# Patient Record
Sex: Male | Born: 1955 | Hispanic: Yes | Marital: Married | Smoking: Former smoker
Health system: Southern US, Community
[De-identification: ages and names within clinical notes are randomized; demographics above are authoritative.]

## PROBLEM LIST (undated history)

## (undated) DIAGNOSIS — M1990 Unspecified osteoarthritis, unspecified site: Secondary | ICD-10-CM

## (undated) DIAGNOSIS — E119 Type 2 diabetes mellitus without complications: Secondary | ICD-10-CM

## (undated) DIAGNOSIS — K219 Gastro-esophageal reflux disease without esophagitis: Secondary | ICD-10-CM

## (undated) DIAGNOSIS — I639 Cerebral infarction, unspecified: Secondary | ICD-10-CM

## (undated) DIAGNOSIS — I10 Essential (primary) hypertension: Secondary | ICD-10-CM

---

## 2013-02-19 ENCOUNTER — Encounter (HOSPITAL_COMMUNITY): Payer: Self-pay

## 2013-02-19 ENCOUNTER — Emergency Department (INDEPENDENT_AMBULATORY_CARE_PROVIDER_SITE_OTHER)
Admission: EM | Admit: 2013-02-19 | Discharge: 2013-02-19 | Disposition: A | Discharge status: Home / Self Care | Payer: Worker's Compensation | Source: Home / Self Care | Attending: Family Medicine | Admitting: Family Medicine

## 2013-02-19 DIAGNOSIS — M5126 Other intervertebral disc displacement, lumbar region: Secondary | ICD-10-CM

## 2013-02-19 HISTORY — DX: Type 2 diabetes mellitus without complications: E11.9

## 2013-02-19 HISTORY — DX: Essential (primary) hypertension: I10

## 2013-02-19 MED ORDER — METHYLPREDNISOLONE 4 MG PO KIT: PACK | ORAL | Status: DC

## 2013-02-19 MED ORDER — CYCLOBENZAPRINE HCL 5 MG PO TABS: 5.0000 mg | ORAL_TABLET | Freq: Three times a day (TID) | ORAL | Status: DC | PRN

## 2013-02-19 MED ORDER — KETOROLAC TROMETHAMINE 30 MG/ML IJ SOLN
30.0000 mg | Freq: Once | INTRAMUSCULAR | Status: AC
  Administered 2013-02-19: 30 mg via INTRAMUSCULAR

## 2013-02-19 MED ORDER — METHYLPREDNISOLONE ACETATE 40 MG/ML IJ SUSP
80.0000 mg | Freq: Once | INTRAMUSCULAR | Status: AC
  Administered 2013-02-19: 80 mg via INTRAMUSCULAR

## 2013-02-19 MED ORDER — KETOROLAC TROMETHAMINE 30 MG/ML IJ SOLN
INTRAMUSCULAR | Status: AC
  Filled 2013-02-19: qty 1

## 2013-02-19 MED ORDER — METHYLPREDNISOLONE ACETATE 80 MG/ML IJ SUSP
INTRAMUSCULAR | Status: AC
  Filled 2013-02-19: qty 1

## 2013-02-19 NOTE — ED Provider Notes (Signed)
History     CSN: [id number]  Arrival date & time 02/19/13  1702   First MD Initiated Contact with Patient 02/19/13 1713      Chief Complaint  Patient presents with  . Back Pain    (Consider location/radiation/quality/duration/timing/severity/associated sxs/prior treatment) Patient is a 57 y.o. male presenting with back pain. The history is provided by the patient.  Back Pain Location:  Lumbar spine Quality:  Shooting Radiates to:  L thigh and L foot Pain severity:  Moderate Onset quality:  Sudden Progression:  Unchanged Chronicity:  New Context: lifting heavy objects and occupational injury   Relieved by:  None tried Associated symptoms: leg pain and numbness   Associated symptoms comment:  Unable to raise left toes when standing.   Past Medical History  Diagnosis Date  . Diabetes mellitus without complication   . Hypertension     History reviewed. No pertinent past surgical history.  History reviewed. No pertinent family history.  History  Substance Use Topics  . Smoking status: Not on file  . Smokeless tobacco: Not on file  . Alcohol Use: Not on file      Review of Systems  Constitutional: Negative.   Gastrointestinal: Negative.   Genitourinary: Negative.   Musculoskeletal: Positive for back pain and gait problem. Negative for joint swelling.  Skin: Negative.   Neurological: Positive for numbness.    Allergies  Review of patient's allergies indicates no known allergies.  Home Medications   Current Outpatient Rx  Name  Route  Sig  Dispense  Refill  . cyclobenzaprine (FLEXERIL) 5 MG tablet   Oral   Take 1 tablet (5 mg total) by mouth 3 (three) times daily as needed for muscle spasms.   30 tablet   0   . methylPREDNISolone (MEDROL DOSEPAK) 4 MG tablet      follow package directions, start on sat, take until finished   21 tablet   0     BP 161/81  Pulse 101  Temp(Src) 98 F (36.7 C) (Oral)  Resp 16  SpO2 97%  Physical Exam  Nursing  note and vitals reviewed. Constitutional: He is oriented to person, place, and time. He appears well-developed and well-nourished. He appears distressed.  Musculoskeletal: He exhibits tenderness.       Lumbar back: He exhibits decreased range of motion, tenderness, pain and spasm. He exhibits no edema and normal pulse.  Neurological: He is alert and oriented to person, place, and time. A sensory deficit is present.  ehl weakness on left, pos slr left, pulse 2+.    ED Course  Procedures (including critical care time)  Labs Reviewed - No data to display No results found.   1. Herniated lumbar intervertebral disc       MDM          Linna Hoff, MD 02/19/13 [phone]

## 2013-02-19 NOTE — ED Notes (Signed)
C/o pain in left hip and back after carrying heavy supplies the day prior (2-21) NAD at present, minimal relief w OTC medications

## 2013-03-30 ENCOUNTER — Other Ambulatory Visit (HOSPITAL_COMMUNITY): Payer: Self-pay | Admitting: Orthopaedic Surgery

## 2013-03-31 NOTE — H&P (Signed)
[facility]   A Division of [employer] [facility]   [address] [place], [place] [address] Telephone: [phone]  Fax: [phone]     PATIENT: Walter, Holland   MR#: [id number]  DOB: 1956-12-03       A 57 year old male returns for followup of his on-the-job injuries continuing to have back pain, leg pain and left foot drop.  His foot drop has not improved.  I can easily overcome anterior tib and EHL with a single finger.  We had ordered and epidural to see if it would help him.  He does have spinal stenosis at 2-3 from a central disk protrusion at this level and then has the large extruded disk fragment at left 4-5 with bunching of the nerve roots above both the 2-3 level and also above the 4-5 level where he has a cephalad migrated fragment that extends up the level of the L4 pedicle.  There is severe compression noted.  Patient is normally followed for his medical problems by Dr. Donette Larry.     PAST MEDICAL HISTORY:  He is a diabetic.     PAST SURGICAL HISTORY:  Denies.   FAMILY HISTORY:  Positive for hypertension, diabetes.   SOCIAL HISTORY:  Patient is divorced.  He is an Oceanographer.  He does not currently smoke, but was a past smoker.   CURRENT MEDICATIONS:  He is on insulin, also metformin.    ALLERGIES:  None.   REVIEW OF SYSTEMS:  A 14-point review of systems positive for acid reflux, diabetes and arthritis.  Patient denies any back problems prior to his on-the-job injury on 02/19/2013 when he was carrying a heavy metal shower up the stairs at work.  He did admit that on occasion he had a little bit of mild discomfort in his back, but never had any weakness.  At the time of the carrying, he had sharp onset of pain.  Someone had to help him and the pain radiated down his back into his left leg and his left calf and into the dorsum of his foot with persistent foot-drop since that time.     PHYSICAL EXAMINATION:  Patient is alert and  oriented, WD, WN, NAD.  Extraocular movements are intact.  No accessory muscle inspiratory effort.  Lungs:  Clear.  Heart:  Regular rate and rhythm.  Abdomen:  Soft, nontender.  No pain with hip range of motion.  Positive straight-leg raising 45 degrees on the left.  Left foot drop.  Anterior tib slight atrophy.  EHL, anterior tib will fire, but are easily overcome with 1-finger pressure.  FHL, posterior tib is strong.  Peroneal is strong.  Gastrocsoleus is normal.  He cannot walk on his heals.  Has foot-drop gait.  Opposite leg shows negative straight-leg raising to 110 degrees.  Knee exam shows good stability.  No effusion.  No pain with hip range of motion.  Negative FABER test.     RADIOGRAPHS:  Previous lumbar x-rays were normal.  MRI scan is as listed above with large disk protrusion causing spinal stenosis and redundant nerve roots above this level due to the severe stenosis with facet and ligamentum hypertrophy and also diffuse bulging of the disk.  Minimal bulge at 3-4 and large extruded fragment at 4-5, cephalad migration with severe compression on the lateral recess.    IMPRESSION: Spinal stenosis L2-3 secondary to central HNP and HNP left L4-5 with free fragment.   PLAN:  At this point, patient would like  to proceed with surgery and wants to skip the epidural injection.  Plan would be left L4 hemilaminectomy and microdiskectomy with removal of the free fragment.  He will also need surgery at the 2-3 level due to the severe spinal stenosis and if only the 4-5, which is the acute level, is operatively treated; he may get some swelling and develop acute cauda equina at the 2-3 level, so both levels would need to be surgically addressed.  He has some foraminal narrowing at 5-1, but no definite nerve root compression and 2-level procedure with diskectomy at these levels.  Removal of some of the ligaments and overhanging spur should take care of his problem.  Patient is rather stoic concerning his pain  and is not taking narcotics, but has got progressive weakness with progression of tension signs and microdiskectomy is indicated.  Usual length of time out of work would be 6 weeks and then he would be back at work with restriction on heavy lifting for approximately 3 weeks with no lifting more than 30 pounds.  Procedure discussed.  Use of the operative microscope.  Overnight stay in the hospital.  Some postoperative pain medication is needed, was discussed.  Risks of surgery including risk of dural tear, recurrent disk herniation, progression of degenerative problems.  He does not have any instability.  I discussed fusion is not indicated.  Questions were elicited and answered.       For additional information please see handwritten notes, reports, orders and prescriptions in this chart.      Lean Fayson C. Ophelia Charter, M.D.

## 2013-04-01 NOTE — Pre-Procedure Instructions (Signed)
Ranson Belluomini  04/01/2013   Your procedure is scheduled on:  Monday, April 14th.  Report to [facility] at 10:30 AM.  Call this number if you have problems the morning of surgery: [phone]   Remember:   Do not eat food or drink liquids after midnight.   Take these medicines the morning of surgery with A SIP OF WATER: Amlodipine (Norvac), Gabapentin (Neurontin) Do not take any diabetic medications the morning of your surgery   Do not wear jewelry  Do not wear lotions or colognes.  Men may shave face and neck.  Do not bring valuables to the hospital.  Contacts, dentures or bridgework may not be worn into surgery.  Leave suitcase in the car. After surgery it may be brought to your room.  For patients admitted to the hospital, checkout time is 11:00 AM the day of discharge.   Patients discharged the day of surgery will not be allowed to drive home.  Name and phone number of your driver: -  Special Instructions: Shower using CHG 2 nights before surgery and the night before surgery.  If you shower the day of surgery use CHG.  Use special wash - you have one bottle of CHG for all showers.  You should use approximately 1/3 of the bottle for each shower.   Please read over the following fact sheets that you were given: Pain Booklet, Coughing and Deep Breathing and Surgical Site Infection Prevention

## 2013-04-02 ENCOUNTER — Encounter (HOSPITAL_COMMUNITY): Payer: Self-pay

## 2013-04-02 ENCOUNTER — Encounter (HOSPITAL_COMMUNITY)
Admission: RE | Admit: 2013-04-02 | Discharge: 2013-04-02 | Disposition: A | Discharge status: Home / Self Care | Payer: Worker's Compensation | Source: Ambulatory Visit | Attending: Orthopaedic Surgery | Admitting: Orthopaedic Surgery

## 2013-04-02 HISTORY — DX: Gastro-esophageal reflux disease without esophagitis: K21.9

## 2013-04-02 HISTORY — DX: Unspecified osteoarthritis, unspecified site: M19.90

## 2013-04-02 LAB — SURGICAL PCR SCREEN: MRSA, PCR: NEGATIVE

## 2013-04-02 LAB — CBC
MCH: 30.5 pg (ref 26.0–34.0)
MCHC: 35.6 g/dL (ref 30.0–36.0)
MCV: 85.8 fL (ref 78.0–100.0)
Platelets: 249 10*3/uL (ref 150–400)
RBC: 4.72 MIL/uL (ref 4.22–5.81)

## 2013-04-02 LAB — COMPREHENSIVE METABOLIC PANEL
ALT: 30 U/L (ref 0–53)
AST: 29 U/L (ref 0–37)
CO2: 29 mEq/L (ref 19–32)
Calcium: 9.8 mg/dL (ref 8.4–10.5)
Creatinine, Ser: 0.79 mg/dL (ref 0.50–1.35)
GFR calc Af Amer: >90 mL/min (ref >90)
GFR calc non Af Amer: >90 mL/min (ref >90)
Sodium: 140 mEq/L (ref 135–145)
Total Protein: 7.6 g/dL (ref 6.0–8.3)

## 2013-04-02 LAB — PROTIME-INR: INR: 0.84 (ref 0.00–1.49)

## 2013-04-02 NOTE — Progress Notes (Signed)
04/02/13 1404  OBSTRUCTIVE SLEEP APNEA  Have you ever been diagnosed with sleep apnea through a sleep study? No  Do you snore loudly (loud enough to be heard through closed doors)?  0  Do you often feel tired, fatigued, or sleepy during the daytime? 0  Has anyone observed you stop breathing during your sleep? 0  Do you have, or are you being treated for high blood pressure? 1  BMI more than 35 kg/m2? 1  Age over 59 years old? 1  Neck circumference greater than 40 cm/18 inches? 1  Gender: 1  Obstructive Sleep Apnea Score 5  Score 4 or greater  Results sent to PCP

## 2013-04-02 NOTE — Progress Notes (Signed)
Patient denied having a stress test, cardiac cath, or sleep study. Interpreter at chair side. PCP is in [place], [place] but patient is unaware of Physicians name.

## 2013-04-04 MED ORDER — CEFAZOLIN SODIUM-DEXTROSE 2-3 GM-% IV SOLR
2.0000 g | INTRAVENOUS | Status: AC
  Administered 2013-04-05: 2 g via INTRAVENOUS
  Filled 2013-04-04: qty 50

## 2013-04-05 ENCOUNTER — Encounter (HOSPITAL_COMMUNITY): Payer: Self-pay | Admitting: Anesthesiology

## 2013-04-05 ENCOUNTER — Encounter (HOSPITAL_COMMUNITY): Payer: Self-pay | Admitting: *Deleted

## 2013-04-05 ENCOUNTER — Ambulatory Visit (HOSPITAL_COMMUNITY): Payer: Worker's Compensation | Admitting: Anesthesiology

## 2013-04-05 ENCOUNTER — Encounter (HOSPITAL_COMMUNITY)
Admission: RE | Disposition: A | Discharge status: Home / Self Care | Payer: Self-pay | Source: Ambulatory Visit | Attending: Orthopaedic Surgery

## 2013-04-05 ENCOUNTER — Ambulatory Visit (HOSPITAL_COMMUNITY): Payer: Worker's Compensation

## 2013-04-05 ENCOUNTER — Observation Stay (HOSPITAL_COMMUNITY)
Admission: RE | Admit: 2013-04-05 | Discharge: 2013-04-06 | Disposition: A | Discharge status: Home / Self Care | Payer: Worker's Compensation | Source: Ambulatory Visit | Attending: Orthopaedic Surgery | Admitting: Orthopaedic Surgery

## 2013-04-05 DIAGNOSIS — E119 Type 2 diabetes mellitus without complications: Secondary | ICD-10-CM | POA: Insufficient documentation

## 2013-04-05 DIAGNOSIS — Z01818 Encounter for other preprocedural examination: Secondary | ICD-10-CM | POA: Insufficient documentation

## 2013-04-05 DIAGNOSIS — M216X9 Other acquired deformities of unspecified foot: Secondary | ICD-10-CM | POA: Insufficient documentation

## 2013-04-05 DIAGNOSIS — Z794 Long term (current) use of insulin: Secondary | ICD-10-CM | POA: Insufficient documentation

## 2013-04-05 DIAGNOSIS — I10 Essential (primary) hypertension: Secondary | ICD-10-CM | POA: Insufficient documentation

## 2013-04-05 DIAGNOSIS — Z01812 Encounter for preprocedural laboratory examination: Secondary | ICD-10-CM | POA: Insufficient documentation

## 2013-04-05 DIAGNOSIS — Z01810 Encounter for preprocedural cardiovascular examination: Secondary | ICD-10-CM | POA: Insufficient documentation

## 2013-04-05 DIAGNOSIS — M5126 Other intervertebral disc displacement, lumbar region: Principal | ICD-10-CM | POA: Insufficient documentation

## 2013-04-05 HISTORY — PX: LUMBAR LAMINECTOMY: SHX95

## 2013-04-05 LAB — HEMOGLOBIN A1C
Hgb A1c MFr Bld: 10.7 % — ABNORMAL HIGH (ref <5.7)
Mean Plasma Glucose: 260 mg/dL — ABNORMAL HIGH (ref <117)

## 2013-04-05 LAB — GLUCOSE, CAPILLARY
Glucose-Capillary: 177 mg/dL — ABNORMAL HIGH (ref 70–99)
Glucose-Capillary: 119 mg/dL — ABNORMAL HIGH (ref 70–99)
Glucose-Capillary: 134 mg/dL — ABNORMAL HIGH (ref 70–99)

## 2013-04-05 SURGERY — MICRODISCECTOMY LUMBAR LAMINECTOMY
Anesthesia: General | Site: Back | Laterality: Left | Wound class: Clean

## 2013-04-05 MED ORDER — GELATIN ABSORBABLE 12-7 MM EX MISC
CUTANEOUS | Status: DC | PRN
  Administered 2013-04-05: 1

## 2013-04-05 MED ORDER — THROMBIN 5000 UNITS EX SOLR
CUTANEOUS | Status: AC
  Filled 2013-04-05: qty 5000

## 2013-04-05 MED ORDER — SODIUM CHLORIDE 0.9 % IV SOLN: 250.0000 mL | INTRAVENOUS | Status: DC

## 2013-04-05 MED ORDER — GLYCOPYRROLATE 0.2 MG/ML IJ SOLN
INTRAMUSCULAR | Status: DC | PRN
  Administered 2013-04-05: 0.4 mg via INTRAVENOUS

## 2013-04-05 MED ORDER — FLEET ENEMA 7-19 GM/118ML RE ENEM: 1.0000 | ENEMA | Freq: Once | RECTAL | Status: AC | PRN

## 2013-04-05 MED ORDER — METHOCARBAMOL 100 MG/ML IJ SOLN: 500.0000 mg | Freq: Four times a day (QID) | INTRAVENOUS | Status: DC | PRN

## 2013-04-05 MED ORDER — ZOLPIDEM TARTRATE 5 MG PO TABS: 5.0000 mg | ORAL_TABLET | Freq: Every evening | ORAL | Status: DC | PRN

## 2013-04-05 MED ORDER — LACTATED RINGERS IV SOLN
INTRAVENOUS | Status: DC | PRN
  Administered 2013-04-05 (×2): via INTRAVENOUS

## 2013-04-05 MED ORDER — GLYBURIDE 5 MG PO TABS
10.0000 mg | ORAL_TABLET | Freq: Two times a day (BID) | ORAL | Status: DC
  Administered 2013-04-06: 10 mg via ORAL
  Filled 2013-04-05 (×3): qty 2

## 2013-04-05 MED ORDER — BENAZEPRIL HCL 20 MG PO TABS
20.0000 mg | ORAL_TABLET | Freq: Every day | ORAL | Status: DC
  Administered 2013-04-06: 20 mg via ORAL
  Filled 2013-04-05: qty 1

## 2013-04-05 MED ORDER — OXYCODONE HCL 5 MG PO TABS: 5.0000 mg | ORAL_TABLET | Freq: Once | ORAL | Status: DC | PRN

## 2013-04-05 MED ORDER — GABAPENTIN 300 MG PO CAPS
300.0000 mg | ORAL_CAPSULE | Freq: Three times a day (TID) | ORAL | Status: DC
  Administered 2013-04-05 – 2013-04-06 (×3): 300 mg via ORAL
  Filled 2013-04-05 (×5): qty 1

## 2013-04-05 MED ORDER — MENTHOL 3 MG MT LOZG: 1.0000 | LOZENGE | OROMUCOSAL | Status: DC | PRN

## 2013-04-05 MED ORDER — METFORMIN HCL 500 MG PO TABS
1000.0000 mg | ORAL_TABLET | Freq: Two times a day (BID) | ORAL | Status: DC
  Administered 2013-04-06: 1000 mg via ORAL
  Filled 2013-04-05 (×3): qty 2

## 2013-04-05 MED ORDER — PANTOPRAZOLE SODIUM 40 MG PO TBEC
40.0000 mg | DELAYED_RELEASE_TABLET | Freq: Every day | ORAL | Status: DC
  Administered 2013-04-05 – 2013-04-06 (×2): 40 mg via ORAL
  Filled 2013-04-05 (×2): qty 1

## 2013-04-05 MED ORDER — ONDANSETRON HCL 4 MG/2ML IJ SOLN: 4.0000 mg | Freq: Four times a day (QID) | INTRAMUSCULAR | Status: DC | PRN

## 2013-04-05 MED ORDER — METOCLOPRAMIDE HCL 5 MG/ML IJ SOLN
INTRAMUSCULAR | Status: DC | PRN
  Administered 2013-04-05: 10 mg via INTRAVENOUS

## 2013-04-05 MED ORDER — ACETAMINOPHEN 325 MG PO TABS: 650.0000 mg | ORAL_TABLET | ORAL | Status: DC | PRN

## 2013-04-05 MED ORDER — CEFAZOLIN SODIUM 1-5 GM-% IV SOLN
1.0000 g | Freq: Three times a day (TID) | INTRAVENOUS | Status: AC
  Administered 2013-04-05 – 2013-04-06 (×2): 1 g via INTRAVENOUS
  Filled 2013-04-05 (×2): qty 50

## 2013-04-05 MED ORDER — HYDROMORPHONE HCL PF 1 MG/ML IJ SOLN
0.2500 mg | INTRAMUSCULAR | Status: DC | PRN
  Administered 2013-04-05 (×2): 0.5 mg via INTRAVENOUS

## 2013-04-05 MED ORDER — MELOXICAM 15 MG PO TABS: 15.0000 mg | ORAL_TABLET | Freq: Every day | ORAL | Status: DC

## 2013-04-05 MED ORDER — KETOROLAC TROMETHAMINE 30 MG/ML IJ SOLN
INTRAMUSCULAR | Status: AC
  Filled 2013-04-05: qty 1

## 2013-04-05 MED ORDER — KETOROLAC TROMETHAMINE 30 MG/ML IJ SOLN
30.0000 mg | Freq: Three times a day (TID) | INTRAMUSCULAR | Status: AC
  Administered 2013-04-05 – 2013-04-06 (×3): 30 mg via INTRAVENOUS
  Filled 2013-04-05 (×3): qty 1

## 2013-04-05 MED ORDER — HYDROMORPHONE HCL PF 1 MG/ML IJ SOLN
INTRAMUSCULAR | Status: AC
Start: 2013-04-05 — End: 2013-04-06
  Filled 2013-04-05: qty 1

## 2013-04-05 MED ORDER — LACTATED RINGERS IV SOLN
INTRAVENOUS | Status: DC
  Administered 2013-04-05: 12:00:00 via INTRAVENOUS

## 2013-04-05 MED ORDER — NEOSTIGMINE METHYLSULFATE 1 MG/ML IJ SOLN
INTRAMUSCULAR | Status: DC | PRN
  Administered 2013-04-05: 3 mg via INTRAVENOUS

## 2013-04-05 MED ORDER — OXYCODONE-ACETAMINOPHEN 5-325 MG PO TABS: 1.0000 | ORAL_TABLET | ORAL | Status: DC | PRN

## 2013-04-05 MED ORDER — BISACODYL 10 MG RE SUPP: 10.0000 mg | Freq: Every day | RECTAL | Status: DC | PRN

## 2013-04-05 MED ORDER — POTASSIUM CHLORIDE IN NACL 20-0.45 MEQ/L-% IV SOLN
INTRAVENOUS | Status: DC
  Filled 2013-04-05 (×3): qty 1000

## 2013-04-05 MED ORDER — INSULIN GLARGINE 100 UNIT/ML ~~LOC~~ SOLN
60.0000 [IU] | Freq: Two times a day (BID) | SUBCUTANEOUS | Status: DC
  Administered 2013-04-05 – 2013-04-06 (×2): 60 [IU] via SUBCUTANEOUS
  Filled 2013-04-05 (×3): qty 0.6

## 2013-04-05 MED ORDER — PROPOFOL 10 MG/ML IV BOLUS
INTRAVENOUS | Status: DC | PRN
  Administered 2013-04-05: 200 mg via INTRAVENOUS

## 2013-04-05 MED ORDER — PHENOL 1.4 % MT LIQD: 1.0000 | OROMUCOSAL | Status: DC | PRN

## 2013-04-05 MED ORDER — MELOXICAM 15 MG PO TABS
15.0000 mg | ORAL_TABLET | Freq: Every day | ORAL | Status: DC
  Administered 2013-04-06: 15 mg via ORAL
  Filled 2013-04-05: qty 1

## 2013-04-05 MED ORDER — ACETAMINOPHEN 650 MG RE SUPP: 650.0000 mg | RECTAL | Status: DC | PRN

## 2013-04-05 MED ORDER — SODIUM CHLORIDE 0.9 % IJ SOLN
3.0000 mL | Freq: Two times a day (BID) | INTRAMUSCULAR | Status: DC
  Administered 2013-04-05: 3 mL via INTRAVENOUS

## 2013-04-05 MED ORDER — AMLODIPINE BESYLATE 10 MG PO TABS
10.0000 mg | ORAL_TABLET | Freq: Every day | ORAL | Status: DC
  Administered 2013-04-06: 10 mg via ORAL
  Filled 2013-04-05: qty 1

## 2013-04-05 MED ORDER — ROCURONIUM BROMIDE 100 MG/10ML IV SOLN
INTRAVENOUS | Status: DC | PRN
  Administered 2013-04-05: 50 mg via INTRAVENOUS
  Administered 2013-04-05: 10 mg via INTRAVENOUS

## 2013-04-05 MED ORDER — OXYCODONE HCL 5 MG/5ML PO SOLN: 5.0000 mg | Freq: Once | ORAL | Status: DC | PRN

## 2013-04-05 MED ORDER — ONDANSETRON HCL 4 MG/2ML IJ SOLN: 4.0000 mg | INTRAMUSCULAR | Status: DC | PRN

## 2013-04-05 MED ORDER — HYDROCODONE-ACETAMINOPHEN 5-325 MG PO TABS: 1.0000 | ORAL_TABLET | ORAL | Status: DC | PRN

## 2013-04-05 MED ORDER — FENTANYL CITRATE 0.05 MG/ML IJ SOLN
INTRAMUSCULAR | Status: DC | PRN
  Administered 2013-04-05: 100 ug via INTRAVENOUS
  Administered 2013-04-05 (×3): 50 ug via INTRAVENOUS
  Administered 2013-04-05: 100 ug via INTRAVENOUS
  Administered 2013-04-05: 50 ug via INTRAVENOUS

## 2013-04-05 MED ORDER — SENNOSIDES-DOCUSATE SODIUM 8.6-50 MG PO TABS: 1.0000 | ORAL_TABLET | Freq: Every evening | ORAL | Status: DC | PRN

## 2013-04-05 MED ORDER — SODIUM CHLORIDE 0.9 % IJ SOLN: 3.0000 mL | INTRAMUSCULAR | Status: DC | PRN

## 2013-04-05 MED ORDER — PHENYLEPHRINE HCL 10 MG/ML IJ SOLN
INTRAMUSCULAR | Status: DC | PRN
  Administered 2013-04-05 (×3): 80 ug via INTRAVENOUS

## 2013-04-05 MED ORDER — ONDANSETRON HCL 4 MG/2ML IJ SOLN
INTRAMUSCULAR | Status: DC | PRN
  Administered 2013-04-05: 4 mg via INTRAVENOUS

## 2013-04-05 MED ORDER — INSULIN ASPART 100 UNIT/ML ~~LOC~~ SOLN
0.0000 [IU] | Freq: Three times a day (TID) | SUBCUTANEOUS | Status: DC
  Administered 2013-04-05: 2 [IU] via SUBCUTANEOUS
  Administered 2013-04-06 (×2): 3 [IU] via SUBCUTANEOUS

## 2013-04-05 MED ORDER — MIDAZOLAM HCL 5 MG/5ML IJ SOLN
INTRAMUSCULAR | Status: DC | PRN
  Administered 2013-04-05: 2 mg via INTRAVENOUS

## 2013-04-05 MED ORDER — BENAZEPRIL-HYDROCHLOROTHIAZIDE 20-12.5 MG PO TABS: 1.0000 | ORAL_TABLET | Freq: Every day | ORAL | Status: DC

## 2013-04-05 MED ORDER — THROMBIN 5000 UNITS EX SOLR
CUTANEOUS | Status: DC | PRN
  Administered 2013-04-05: 5000 [IU] via TOPICAL

## 2013-04-05 MED ORDER — METHOCARBAMOL 500 MG PO TABS
500.0000 mg | ORAL_TABLET | Freq: Four times a day (QID) | ORAL | Status: DC | PRN
  Filled 2013-04-05: qty 1

## 2013-04-05 MED ORDER — 0.9 % SODIUM CHLORIDE (POUR BTL) OPTIME
TOPICAL | Status: DC | PRN
  Administered 2013-04-05: 1000 mL

## 2013-04-05 MED ORDER — MORPHINE SULFATE 2 MG/ML IJ SOLN: 1.0000 mg | INTRAMUSCULAR | Status: DC | PRN

## 2013-04-05 MED ORDER — DOCUSATE SODIUM 100 MG PO CAPS
100.0000 mg | ORAL_CAPSULE | Freq: Two times a day (BID) | ORAL | Status: DC
  Administered 2013-04-05 – 2013-04-06 (×2): 100 mg via ORAL
  Filled 2013-04-05 (×2): qty 1

## 2013-04-05 MED ORDER — LIDOCAINE HCL (CARDIAC) 20 MG/ML IV SOLN
INTRAVENOUS | Status: DC | PRN
  Administered 2013-04-05: 100 mg via INTRAVENOUS

## 2013-04-05 MED ORDER — HYDROCHLOROTHIAZIDE 12.5 MG PO CAPS
12.5000 mg | ORAL_CAPSULE | Freq: Every day | ORAL | Status: DC
  Administered 2013-04-06: 12.5 mg via ORAL
  Filled 2013-04-05: qty 1

## 2013-04-05 SURGICAL SUPPLY — 54 items
ADH SKN CLS APL DERMABOND .7 (GAUZE/BANDAGES/DRESSINGS) ×1
BUR ROUND FLUTED 4 SOFT TCH (BURR) ×1 IMPLANT
CLOTH BEACON ORANGE TIMEOUT ST (SAFETY) ×2 IMPLANT
CORDS BIPOLAR (ELECTRODE) ×2 IMPLANT
COVER SURGICAL LIGHT HANDLE (MISCELLANEOUS) ×2 IMPLANT
DERMABOND ADVANCED (GAUZE/BANDAGES/DRESSINGS) ×1
DERMABOND ADVANCED .7 DNX12 (GAUZE/BANDAGES/DRESSINGS) ×1 IMPLANT
DRAPE MICROSCOPE LEICA (MISCELLANEOUS) ×2 IMPLANT
DRAPE PROXIMA HALF (DRAPES) ×4 IMPLANT
DRSG EMULSION OIL 3X3 NADH (GAUZE/BANDAGES/DRESSINGS) ×1 IMPLANT
DRSG MEPILEX BORDER 4X4 (GAUZE/BANDAGES/DRESSINGS) ×1 IMPLANT
DRSG MEPILEX BORDER 4X8 (GAUZE/BANDAGES/DRESSINGS) ×2 IMPLANT
DURAPREP 26ML APPLICATOR (WOUND CARE) ×2 IMPLANT
ELECT BLADE 4.0 EZ CLEAN MEGAD (MISCELLANEOUS) ×2
ELECT CAUTERY BLADE 6.4 (BLADE) ×1 IMPLANT
ELECT REM PT RETURN 9FT ADLT (ELECTROSURGICAL) ×2
ELECTRODE BLDE 4.0 EZ CLN MEGD (MISCELLANEOUS) IMPLANT
ELECTRODE REM PT RTRN 9FT ADLT (ELECTROSURGICAL) ×1 IMPLANT
GLOVE BIOGEL PI IND STRL 7.5 (GLOVE) ×1 IMPLANT
GLOVE BIOGEL PI IND STRL 8 (GLOVE) ×1 IMPLANT
GLOVE BIOGEL PI INDICATOR 7.5 (GLOVE) ×1
GLOVE BIOGEL PI INDICATOR 8 (GLOVE) ×1
GLOVE ECLIPSE 7.0 STRL STRAW (GLOVE) ×2 IMPLANT
GLOVE ORTHO TXT STRL SZ7.5 (GLOVE) ×2 IMPLANT
GOWN PREVENTION PLUS LG XLONG (DISPOSABLE) IMPLANT
GOWN STRL NON-REIN LRG LVL3 (GOWN DISPOSABLE) ×5 IMPLANT
KIT BASIN OR (CUSTOM PROCEDURE TRAY) ×2 IMPLANT
KIT ROOM TURNOVER OR (KITS) ×2 IMPLANT
MANIFOLD NEPTUNE II (INSTRUMENTS) ×2 IMPLANT
NDL HYPO 25GX1X1/2 BEV (NEEDLE) ×1 IMPLANT
NDL SPNL 18GX3.5 QUINCKE PK (NEEDLE) ×1 IMPLANT
NDL SUT .5 MAYO 1.404X.05X (NEEDLE) ×1 IMPLANT
NEEDLE HYPO 25GX1X1/2 BEV (NEEDLE) ×2 IMPLANT
NEEDLE MAYO TAPER (NEEDLE)
NEEDLE SPNL 18GX3.5 QUINCKE PK (NEEDLE) ×2 IMPLANT
NS IRRIG 1000ML POUR BTL (IV SOLUTION) ×2 IMPLANT
PACK LAMINECTOMY ORTHO (CUSTOM PROCEDURE TRAY) ×2 IMPLANT
PAD ARMBOARD 7.5X6 YLW CONV (MISCELLANEOUS) ×4 IMPLANT
PATTIES SURGICAL .5 X.5 (GAUZE/BANDAGES/DRESSINGS) ×1 IMPLANT
PATTIES SURGICAL .75X.75 (GAUZE/BANDAGES/DRESSINGS) ×1 IMPLANT
SPONGE GAUZE 4X4 12PLY (GAUZE/BANDAGES/DRESSINGS) ×2 IMPLANT
SPONGE SURGIFOAM ABS GEL SZ50 (HEMOSTASIS) ×1 IMPLANT
SUT VIC AB 0 CT1 27 (SUTURE) ×2
SUT VIC AB 0 CT1 27XBRD ANBCTR (SUTURE) IMPLANT
SUT VIC AB 2-0 CT1 27 (SUTURE) ×4
SUT VIC AB 2-0 CT1 TAPERPNT 27 (SUTURE) ×1 IMPLANT
SUT VICRYL 0 TIES 12 18 (SUTURE) ×1 IMPLANT
SUT VICRYL 4-0 PS2 18IN ABS (SUTURE) IMPLANT
SUT VICRYL AB 2 0 TIES (SUTURE) ×1 IMPLANT
SYR 20ML ECCENTRIC (SYRINGE) IMPLANT
SYR CONTROL 10ML LL (SYRINGE) ×2 IMPLANT
TOWEL OR 17X24 6PK STRL BLUE (TOWEL DISPOSABLE) ×2 IMPLANT
TOWEL OR 17X26 10 PK STRL BLUE (TOWEL DISPOSABLE) ×2 IMPLANT
WATER STERILE IRR 1000ML POUR (IV SOLUTION) ×2 IMPLANT

## 2013-04-05 NOTE — Plan of Care (Signed)
Problem: Consults Goal: Diagnosis - Spinal Surgery Microdiscectomy L2-L3, L4-L5

## 2013-04-05 NOTE — Anesthesia Postprocedure Evaluation (Signed)
  Anesthesia Post-op Note  Patient: Walter Holland  Procedure(s) Performed: Procedure(s) with comments: MICRODISCECTOMY LUMBAR LAMINECTOMY (Left) - Left L2-3, L4-5 Decompression, Microdiscectomy, hemi-laminectomy  Patient Location: PACU  Anesthesia Type:General  Level of Consciousness: awake  Airway and Oxygen Therapy: Patient Spontanous Breathing  Post-op Pain: mild  Post-op Assessment: Post-op Vital signs reviewed, Patient's Cardiovascular Status Stable, Respiratory Function Stable, Patent Airway, No signs of Nausea or vomiting and Pain level controlled  Post-op Vital Signs: stable  Complications: No apparent anesthesia complications

## 2013-04-05 NOTE — Preoperative (Signed)
Beta Blockers   Reason not to administer Beta Blockers:Not Applicable 

## 2013-04-05 NOTE — Interval H&P Note (Signed)
History and Physical Interval Note:  04/05/2013 12:28 PM  Walter Holland  has presented today for surgery, with the diagnosis of Stenosis L2-3, L4-5 HNP, Free Fragment  The various methods of treatment have been discussed with the patient and family. After consideration of risks, benefits and other options for treatment, the patient has consented to  Procedure(s) with comments: MICRODISCECTOMY LUMBAR LAMINECTOMY (Left) - Left L2-3, L4-5 Decompression, Microdiscectomy as a surgical intervention .  The patient's history has been reviewed, patient examined, no change in status, stable for surgery.  I have reviewed the patient's chart and labs.  Questions were answered to the patient's satisfaction.     Amadea Keagy C

## 2013-04-05 NOTE — Brief Op Note (Cosign Needed)
04/05/2013  2:52 PM  PATIENT:  Walter Holland  57 y.o. male  PRE-OPERATIVE DIAGNOSIS:  Stenosis L2-3, L4-5 HNP, Free Fragment  POST-OPERATIVE DIAGNOSIS:  stenosis L2-3, 4-5, herniated nucleus pulposis, free fragment  PROCEDURE:  Procedure(s) with comments: MICRODISCECTOMY LUMBAR LAMINECTOMY (Left) - Left L2-3, L4-5 Decompression, Microdiscectomy, hemi-laminectomy  SURGEON:  Surgeon(s) and Role:    * Eldred Manges, MD - Primary  PHYSICIAN ASSISTANT: Maud Deed PAC  ASSISTANTS: none   ANESTHESIA:   general  EBL:  Total I/O In: 1000 [I.V.:1000] Out: 100 [Blood:100]  BLOOD ADMINISTERED:none  DRAINS: none   LOCAL MEDICATIONS USED:  NONE  SPECIMEN:  No Specimen  DISPOSITION OF SPECIMEN:  N/A  COUNTS:  YES  TOURNIQUET:  * No tourniquets in log *  DICTATION: .Note written in EPIC  PLAN OF CARE: overnight  PATIENT DISPOSITION:  PACU - hemodynamically stable.   Delay start of Pharmacological VTE agent (>24hrs) due to surgical blood loss or risk of bleeding: yes

## 2013-04-05 NOTE — Anesthesia Preprocedure Evaluation (Signed)
Anesthesia Evaluation  Patient identified by MRN, date of birth, ID band Patient awake    Reviewed: Allergy & Precautions, H&P , NPO status , Patient's Chart, lab work & pertinent test results  Airway Mallampati: II  Neck ROM: full    Dental   Pulmonary former smoker,          Cardiovascular hypertension,     Neuro/Psych    GI/Hepatic GERD-  ,  Endo/Other  diabetes, Type 2obese  Renal/GU      Musculoskeletal  (+) Arthritis -,   Abdominal   Peds  Hematology   Anesthesia Other Findings   Reproductive/Obstetrics                           Anesthesia Physical Anesthesia Plan  ASA: II  Anesthesia Plan: General   Post-op Pain Management:    Induction: Intravenous  Airway Management Planned: Oral ETT  Additional Equipment:   Intra-op Plan:   Post-operative Plan: Extubation in OR  Informed Consent: I have reviewed the patients History and Physical, chart, labs and discussed the procedure including the risks, benefits and alternatives for the proposed anesthesia with the patient or authorized representative who has indicated his/her understanding and acceptance.     Plan Discussed with: CRNA and Surgeon  Anesthesia Plan Comments:         Anesthesia Quick Evaluation

## 2013-04-05 NOTE — Transfer of Care (Signed)
Immediate Anesthesia Transfer of Care Note  Patient: Walter Holland  Procedure(s) Performed: Procedure(s) with comments: MICRODISCECTOMY LUMBAR LAMINECTOMY (Left) - Left L2-3, L4-5 Decompression, Microdiscectomy, hemi-laminectomy  Patient Location: PACU  Anesthesia Type:General  Level of Consciousness: awake, alert , oriented and patient cooperative  Airway & Oxygen Therapy: Patient Spontanous Breathing and Patient connected to nasal cannula oxygen  Post-op Assessment: Report given to PACU RN, Post -op Vital signs reviewed and stable and Patient moving all extremities  Post vital signs: Reviewed and stable  Complications: No apparent anesthesia complications

## 2013-04-06 ENCOUNTER — Encounter (HOSPITAL_COMMUNITY): Payer: Self-pay | Admitting: Orthopaedic Surgery

## 2013-04-06 DIAGNOSIS — E119 Type 2 diabetes mellitus without complications: Secondary | ICD-10-CM | POA: Diagnosis present

## 2013-04-06 LAB — GLUCOSE, CAPILLARY: Glucose-Capillary: 189 mg/dL — ABNORMAL HIGH (ref 70–99)

## 2013-04-06 MED ORDER — PNEUMOCOCCAL VAC POLYVALENT 25 MCG/0.5ML IJ INJ: 0.5000 mL | INJECTION | INTRAMUSCULAR | Status: DC

## 2013-04-06 NOTE — Op Note (Signed)
NAMEJAQUARIOUS, GREY NO.:  [id number]  MEDICAL RECORD NO.:  [id number]  LOCATION:  5N08C                        FACILITY:  [facility]  PHYSICIAN:  Jake Goodson C. Ophelia Charter, M.D.    DATE OF BIRTH:  [birth year]  DATE OF PROCEDURE:  04/05/2013 DATE OF DISCHARGE:                              OPERATIVE REPORT   PREOPERATIVE DIAGNOSES:  L2-3 large herniated nucleus pulposus with stenosis.  L4-5 herniated nucleus pulposus with large free fragment.  POSTOPERATIVE DIAGNOSES:  L2-3 large herniated nucleus pulposus with stenosis. L4-5 herniated nucleus pulposus with large free fragment.  PROCEDURE:  Left L4 hemilaminectomy, lateral decompression and removal of large free fragment.  Left L2-3 diskectomy, L2 left laminotomy and removal of large disk herniation with lateral and central decompression.  SURGEON:  Rosita Guzzetta C. Ophelia Charter, M.D.  ASSISTANT:  Wende Neighbors, PA-C, medically necessary and present for the entire procedure.  ANESTHESIA:  GOT.  EBL:  Less than 100 mL.  INDICATIONS:  This 57 year old male had on-job injury with large free fragment at 4-5, which is taking up to 70-80% of the canal.  He had severe weakness leg giving way on the left, and MRI scan also showed large disk herniation at the 2-3 level, which was likely an old injury, but also is causing severe stenosis at L2-3 level centrally with extension into both foramina causing severe central stenosis.  PROCEDURE:  After induction of general anesthesia, orotracheal intubation, preoperative antibiotics, the patient was placed supine on chest rolls, careful padding, shoulder pads and over the ulnar nerve. Back was prepped with DuraPrep.  The area was squared with towels.  Time- out procedure was performed.  While DuraPrep was drying, large Betadine Steri-Drape was applied, laminectomy sheet.  Needle localization at 4-5 and 2-3 level was placed with spinal needles, there was a delay in x-ray arriving.  Incision was made  at the 4-5 level.  Subperiosteal dissection on the left few millimeters off the midline was performed.  Expected level at 4-5 had #4 Penfield placed and the needle was placed at 2-3 level.  Cross-table lateral x-ray showed the 2-3 needle was in good position and the Hale 4 was at 3-4.  Excision was extended distally. Lamina at 4 was enlarged and thinned with a Kerrison rongeur.  Taylor retractor was placed on the left with 5-pound weight.  Penfield 4 was placed at 4-5 and then Therapist, nutritional at the 2-3 level and a second x- ray was taken confirming that the both levels surgically planned were properly identified.  4-5 level was exposed first.  Lamina was thinned down and left hemilaminectomy was performed moving all the lamina.  Care was taken to preserve the pars.  Thick chunks of ligament were removed and medially above the disk.  There were multiple large disk fragments, which were suctioned out.  Some of them had a little bit of hemosiderin stain.  Multiple fragments were removed using the black nerve hook, ball- tip nerve hook, pulling that to the lateral gutter and then removed with micropituitary using the operative microscope that had been draped. Continued microdiskectomy was performed.  Disk space showed large bulge and there was some calcification of the annulus.  It  was incised around an area that was not calcified, thinned down with the 2-mm Kerrison passes were made with micropituitary, straight pituitaries, and using the Epstein, calcified disk was pushed in the disk space and then removed until the 4-5 level was decompressed.  Disk was flat and the entire sac for the disk herniation had migrated cephalad almost up to the 3-4 level had been completely decompressed.  Passes were made anterior to the dura with hockey stick with no areas of compression. Disk space was copiously irrigated.  Taylor retractor was then moved up to the 2-3 level, which had been marked directly  on the bone with the bur and also with skin marker directly on the bone.  Taylor retractor was placed.  Laminotomy was performed.  Removal of chunks of ligament and some overhanging spurs from the set.  Disk was also hard, partially calcified, large and bulging with some fragments inferior to the level of the disk.  Pieces were teased out and continued to work until finally the nerve and dura was free.  Up-down micropituitary straight Epstein curettes were used.  This level primarily had central stenosis, and once the layer between the dura and calcified disk was separated, calcified annulus was able to be pushed down into the disk space and then chunks of pieces were removed until it was flat and even with the back of the L2 and L3 body.  With the hockey stick, there was a little bit of prominence on the opposite side, but there was adequate decompression removing the central air, which was causing the stenosis, large central calcified disk bulge with removal of multiple large chunk that were extruded causing the protrusion and compression.  There was some epidural bleeding present.  Some pieces of thrombin-soaked Gelfoam, the cautery using the operative microscope, bipolar.  so, angled cautery tips were used to help withdrawal some of the venous bleeding.  Thrombin- soaked Gelfoam and patties were placed.  After few minutes, the lateral gutter was dry.  Some further bipolar use was performed.  The field was dry, repeat irrigation, rechecking epidural bleeding, and again it was dry.  Disk space were checked with the hockey stick run anterior to the dura both 4-5 and the 2-3 level one final time to make sure there were no remaining areas of compression since there were so many large pieces of disk that had been present.  Both levels looked good, clean, no areas of remaining compression.  Deep layer was closed with #1 Vicryl, 2-0 Vicryl in the subcutaneous tissue, 4-0 Vicryl in the  subcuticular closure.  Dermabond was applied.  Postop dressing and then transferred to the recovery room.     Anessia Oakland C. Ophelia Charter, M.D.     MCY/MEDQ  D:  04/05/2013  T:  04/06/2013  Job:  [id number]

## 2013-04-06 NOTE — Progress Notes (Signed)
UR COMPLETED  

## 2013-04-06 NOTE — Progress Notes (Signed)
Subjective: 1 Day Post-Op Procedure(s) (LRB): MICRODISCECTOMY LUMBAR LAMINECTOMY (Left) Patient reports pain as mild.  Has been walking in hallway.  Voiding and eating.  Objective: Vital signs in last 24 hours: Temp:  [98.1 F (36.7 C)-98.7 F (37.1 C)] 98.7 F (37.1 C) (04/15 0635) Pulse Rate:  [59-90] 90 (04/15 0635) Resp:  [8-18] 18 (04/15 0635) BP: (135-173)/(75-88) 164/88 mmHg (04/15 0930) SpO2:  [97 %-100 %] 98 % (04/15 0635)  Intake/Output from previous day: 04/14 0701 - 04/15 0700 In: 1000 [I.V.:1000] Out: 100 [Blood:100] Intake/Output this shift: Total I/O In: 480 [P.O.:480] Out: -   No results found for this basename: HGB,  in the last 72 hours No results found for this basename: WBC, RBC, HCT, PLT,  in the last 72 hours No results found for this basename: NA, K, CL, CO2, BUN, CREATININE, GLUCOSE, CALCIUM,  in the last 72 hours No results found for this basename: LABPT, INR,  in the last 72 hours  Neurovascular intact Sensation intact distally Incision: dressing C/D/I  Assessment/Plan: 1 Day Post-Op Procedure(s) (LRB): MICRODISCECTOMY LUMBAR LAMINECTOMY (Left) Discharge home today. OV 2 weeks RX percocet on chart.    Shmiel Morton M 04/06/2013, 11:24 AM

## 2013-04-06 NOTE — Progress Notes (Signed)
Patient discharge at 1320. Discharge instructions reviewed and patient verbalized understanding. Mepilex changed to mid to lower back incision. Provided patient with additional supplies and instructions, verbalized understanding. No questions at this time.

## 2013-04-12 NOTE — Discharge Summary (Signed)
Physician Discharge Summary  Patient ID: Walter Holland MRN: [id number] DOB/AGE: July 11, 1956 57 y.o.  Admit date: 04/05/2013 Discharge date: 04/06/2013 Admission Diagnoses:  HNP (herniated nucleus pulposus), lumbar Spinal stenosis Left L2-3 and Left L4-5  Discharge Diagnoses:  Principal Problem:   HNP (herniated nucleus pulposus), lumbar Active Problems:   Type II or unspecified type diabetes mellitus without mention of complication, not stated as uncontrolled   Past Medical History  Diagnosis Date  . Diabetes mellitus without complication   . Hypertension   . GERD (gastroesophageal reflux disease)   . Arthritis     Surgeries: Procedure(s): MICRODISCECTOMY LUMBAR LAMINECTOMY left L4-5 and Left L2-3 with lateral recess decompression and excision of large free fragment Left L4-5  on 04/05/2013   Consultants (if any):  none  Discharged Condition: Improved  Hospital Course: Walter Holland is an 57 y.o. male who was admitted 04/05/2013 with a diagnosis of HNP (herniated nucleus pulposus), lumbar and went to the operating room on 04/05/2013 and underwent the above named procedures.    He was given perioperative antibiotics:  Anti-infectives   Start     Dose/Rate Route Frequency Ordered Stop   04/05/13 2030  ceFAZolin (ANCEF) IVPB 1 g/50 mL premix     1 g 100 mL/hr over 30 Minutes Intravenous Every 8 hours 04/05/13 1629 04/06/13 0500   04/05/13 0600  ceFAZolin (ANCEF) IVPB 2 g/50 mL premix     2 g 100 mL/hr over 30 Minutes Intravenous On call to O.R. 04/04/13 1318 04/05/13 1240    .  He was given sequential compression devices, early ambulation for DVT prophylaxis.  He benefited maximally from the hospital stay and there were no complications.    Recent vital signs:  Filed Vitals:   04/06/13 0930  BP: 164/88  Pulse:   Temp:   Resp:     Recent laboratory studies:  Lab Results  Component Value Date   HGB 14.4 04/02/2013   Lab Results  Component Value Date   WBC  11.2* 04/02/2013   PLT 249 04/02/2013   Lab Results  Component Value Date   INR 0.84 04/02/2013   Lab Results  Component Value Date   NA 140 04/02/2013   K 3.7 04/02/2013   CL 101 04/02/2013   CO2 29 04/02/2013   BUN 17 04/02/2013   CREATININE 0.79 04/02/2013   GLUCOSE 67* 04/02/2013    Discharge Medications:     Medication List    TAKE these medications       amLODipine 10 MG tablet  Commonly known as:  NORVASC  Take 10 mg by mouth daily.     benazepril-hydrochlorthiazide 20-12.5 MG per tablet  Commonly known as:  LOTENSIN HCT  Take 1 tablet by mouth daily.     gabapentin 300 MG capsule  Commonly known as:  NEURONTIN  Take 300 mg by mouth 3 (three) times daily.     glyBURIDE 5 MG tablet  Commonly known as:  DIABETA  Take 10 mg by mouth 2 (two) times daily with a meal.     insulin aspart 100 UNIT/ML injection  Commonly known as:  novoLOG  Inject 40 Units into the skin 2 (two) times daily.     insulin glargine 100 UNIT/ML injection  Commonly known as:  LANTUS  Inject 60 Units into the skin 2 (two) times daily.     meloxicam 15 MG tablet  Commonly known as:  MOBIC  Take 15 mg by mouth daily.     metFORMIN 1000 MG  tablet  Commonly known as:  GLUCOPHAGE  Take 1,000 mg by mouth 2 (two) times daily with a meal.     omeprazole 20 MG capsule  Commonly known as:  PRILOSEC  Take 20 mg by mouth 2 (two) times daily.     oxyCODONE-acetaminophen 5-325 MG per tablet  Commonly known as:  ROXICET  Take 1-2 tablets by mouth every 4 (four) hours as needed for pain.     tiZANidine 4 MG tablet  Commonly known as:  ZANAFLEX  Take 4 mg by mouth every 6 (six) hours as needed (for muscle pain).        Diagnostic Studies: Dg Chest 2 View  04/02/2013  *RADIOLOGY REPORT*  Clinical Data: Preoperative respiratory films.  CHEST - 2 VIEW  Comparison: None.  Findings: Lungs are clear. Heart size normal. No pneumothorax or pleural effusion.  IMPRESSION: No acute disease.   Original Report  Authenticated By: Holley Dexter, M.D.    Dg Lumbar Spine 2-3 Views  04/05/2013  *RADIOLOGY REPORT*  Clinical Data: 57 year old male undergoing lumbar surgery.  LUMBAR SPINE - 2-3 VIEW  Comparison: [facility] lumbar MRI 02/27/2013.  Findings: Same numbering system used as on the comparison.  Two intraoperative portable cross-table lateral views of lumbar spine.  Normal lumbar segmentation is evident on the second view.  Film #1 at 1306 hours.  Cephalad needle directed at the L2 pedicle level.  Caudal surgical probe directed at the L4 pedicle level.  Film #2 the 1315 hours.  Upper retractor and cervical probe at the L2-L3 level.  Lower retractor and surgical probe at the L4-L5 level.  IMPRESSION: Intraoperative localization as detailed above.   Original Report Authenticated By: Erskine Speed, M.D.     Disposition: 01-Home or Self Care      Discharge Orders   Future Orders Complete By Expires     Call MD / Call 911  As directed     Comments:      If you experience chest pain or shortness of breath, CALL 911 and be transported to the hospital emergency room.  If you develope a fever above 101 F, pus (white drainage) or increased drainage or redness at the wound, or calf pain, call your surgeon's office.    Constipation Prevention  As directed     Comments:      Drink plenty of fluids.  Prune juice may be helpful.  You may use a stool softener, such as Colace (over the counter) 100 mg twice a day.  Use MiraLax (over the counter) for constipation as needed.    Diet - low sodium heart healthy  As directed     Driving restrictions  As directed     Comments:      No driving    Increase activity slowly as tolerated  As directed     Lifting restrictions  As directed     Comments:      No lifting     No lifting greater than 10 lbs. Avoid bending, stooping and twisting. Walk in house for first week them may start to get out slowly increasing distance up to one mile by 3  weeks post op. Keep incision dry for 3 days, may use tegaderm or similar water impervious dressing.   Follow-up Information   Follow up with Eldred Manges, MD. Schedule an appointment as soon as possible for a visit in 2 weeks.   Contact information:   [address] [place] [address] [phone]  Signed: Wende Neighbors 04/12/2013, 9:57 AM

## 2013-11-07 IMAGING — CR DG CHEST 2V
2 series · 2 of 2 positions shown · non-contrast
Comparison: None.

CLINICAL DATA: Preoperative respiratory films.

CHEST - 2 VIEW

[view not recorded (1 of 2)]
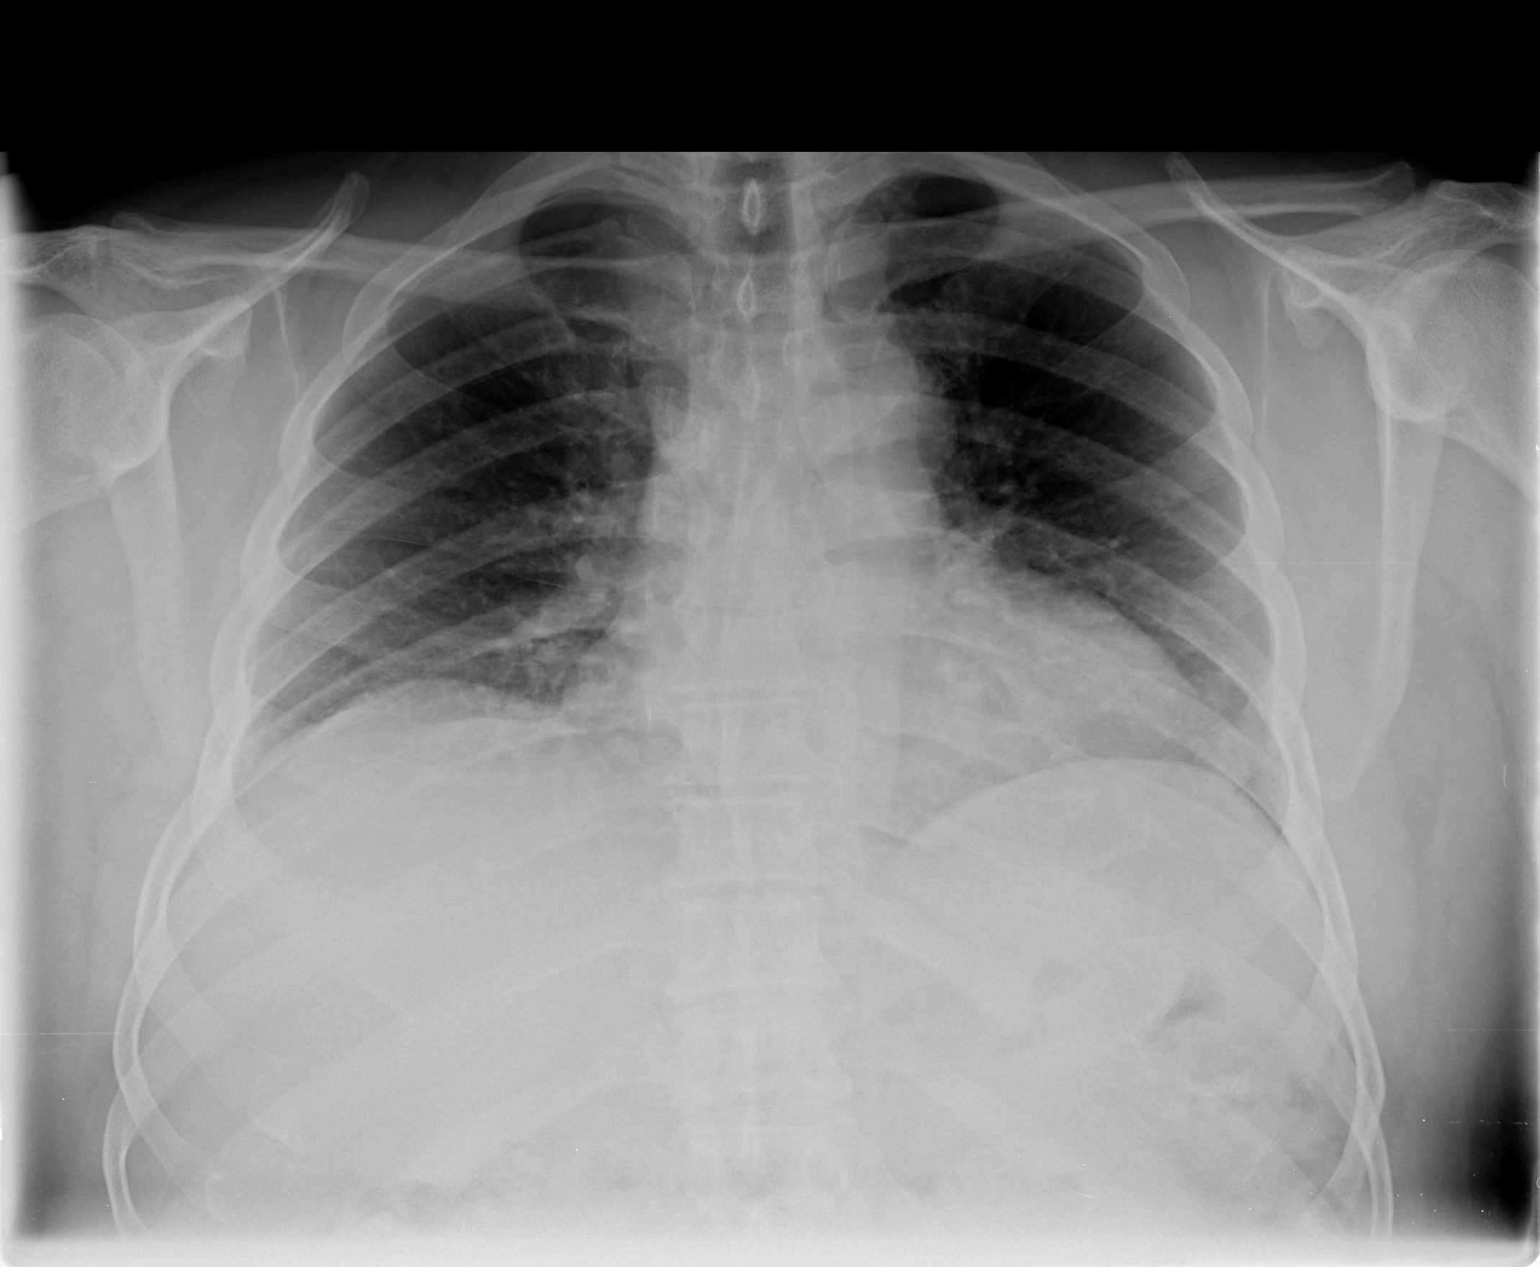

[view not recorded (2 of 2)]
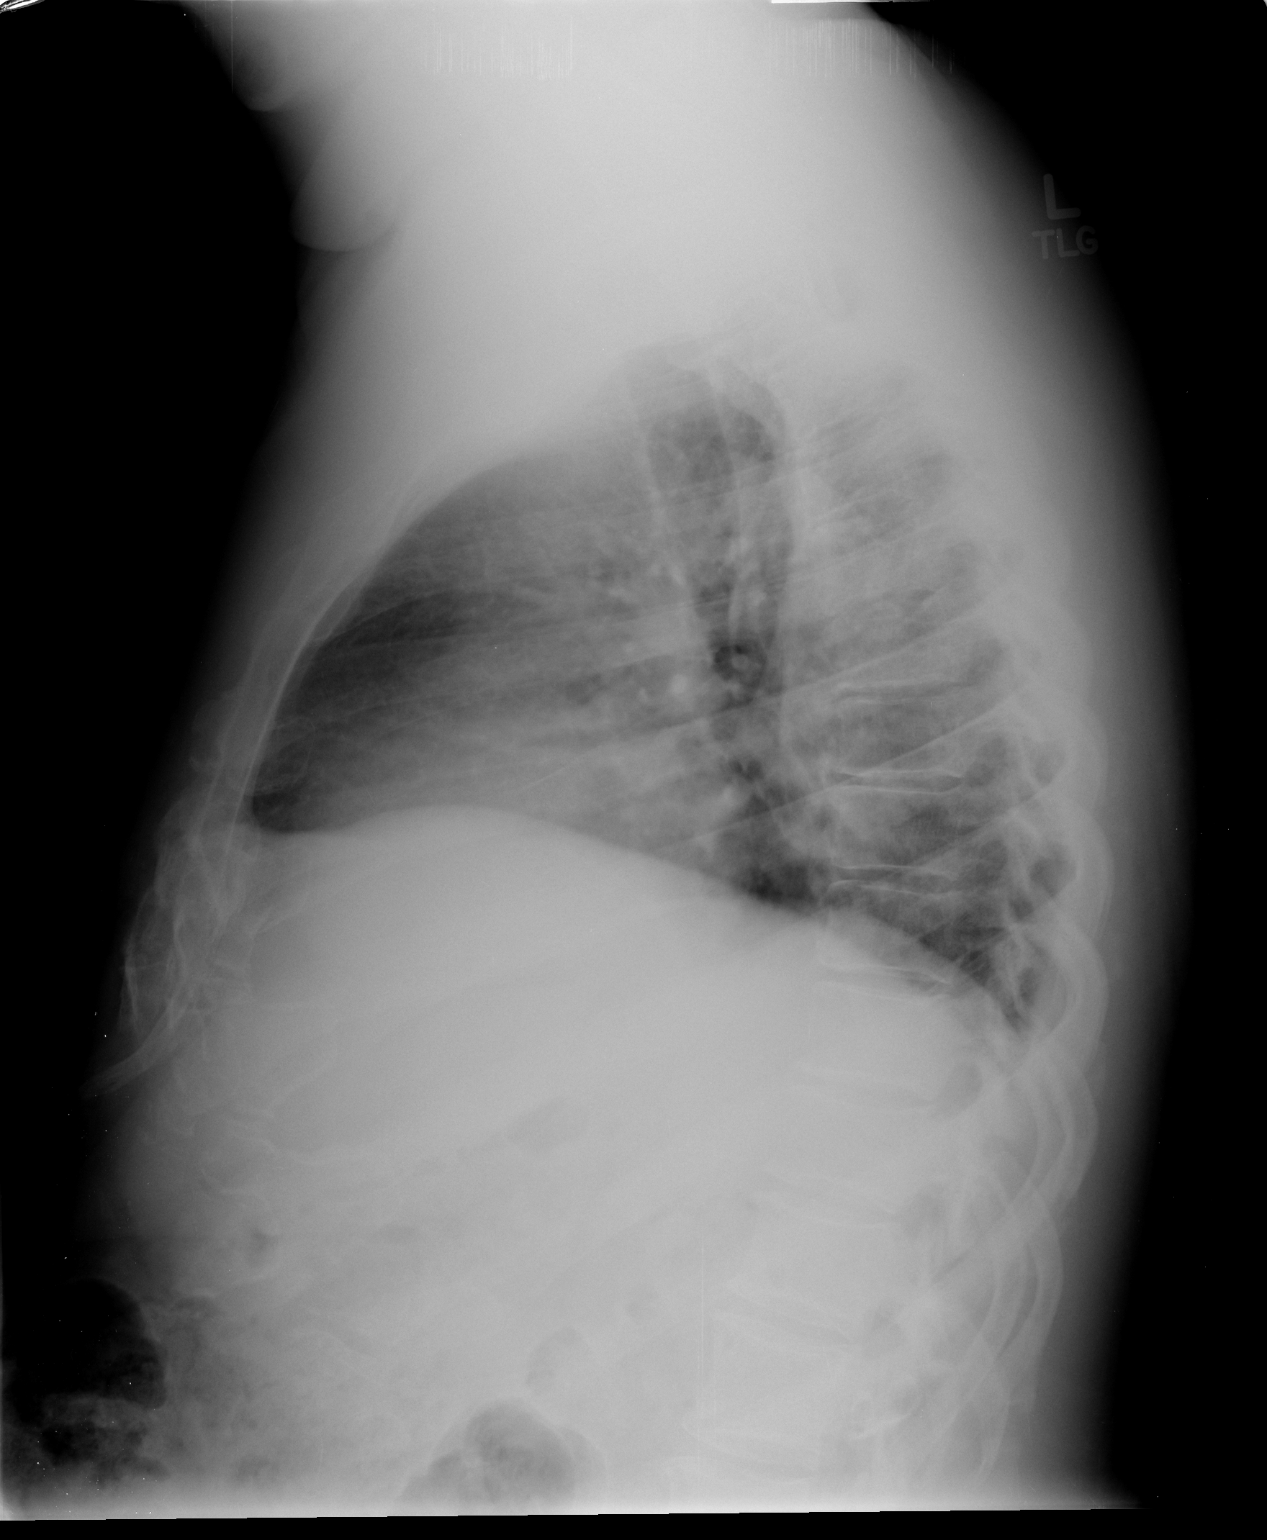

[2 of 2 positions shown; findings below may reference images not displayed]

FINDINGS: Lungs are clear. Heart size normal. No pneumothorax or
pleural effusion.
IMPRESSION: No acute disease.

## 2016-09-16 ENCOUNTER — Other Ambulatory Visit: Payer: Self-pay | Admitting: Neurological Surgery

## 2016-10-07 ENCOUNTER — Ambulatory Visit (HOSPITAL_COMMUNITY)
Admission: RE | Admit: 2016-10-07 | Discharge: 2016-10-07 | Disposition: A | Discharge status: Home / Self Care | Payer: Worker's Compensation | Source: Ambulatory Visit | Attending: Neurological Surgery | Admitting: Neurological Surgery

## 2016-10-07 ENCOUNTER — Encounter (HOSPITAL_COMMUNITY)
Admission: RE | Admit: 2016-10-07 | Discharge: 2016-10-07 | Disposition: A | Discharge status: Home / Self Care | Payer: Worker's Compensation | Source: Ambulatory Visit | Attending: Neurological Surgery | Admitting: Neurological Surgery

## 2016-10-07 ENCOUNTER — Encounter (HOSPITAL_COMMUNITY): Payer: Self-pay

## 2016-10-07 DIAGNOSIS — Z01818 Encounter for other preprocedural examination: Secondary | ICD-10-CM | POA: Insufficient documentation

## 2016-10-07 DIAGNOSIS — M48061 Spinal stenosis, lumbar region without neurogenic claudication: Secondary | ICD-10-CM

## 2016-10-07 HISTORY — DX: Cerebral infarction, unspecified: I63.9

## 2016-10-07 LAB — CBC WITH DIFFERENTIAL/PLATELET
Basophils Absolute: 0 10*3/uL (ref 0.0–0.1)
Basophils Relative: 0 %
EOS ABS: 0.1 10*3/uL (ref 0.0–0.7)
EOS PCT: 1 %
HCT: 46.6 % (ref 39.0–52.0)
Hemoglobin: 16 g/dL (ref 13.0–17.0)
LYMPHS ABS: 4.2 10*3/uL — AB (ref 0.7–4.0)
Lymphocytes Relative: 42 %
MCH: 30.6 pg (ref 26.0–34.0)
MCHC: 34.3 g/dL (ref 30.0–36.0)
MCV: 89.1 fL (ref 78.0–100.0)
MONOS PCT: 8 %
Monocytes Absolute: 0.8 10*3/uL (ref 0.1–1.0)
Neutro Abs: 5 10*3/uL (ref 1.7–7.7)
Neutrophils Relative %: 49 %
PLATELETS: 247 10*3/uL (ref 150–400)
RBC: 5.23 MIL/uL (ref 4.22–5.81)
RDW: 13.4 % (ref 11.5–15.5)
WBC: 10.1 10*3/uL (ref 4.0–10.5)

## 2016-10-07 LAB — BASIC METABOLIC PANEL
Anion gap: 9 (ref 5–15)
BUN: 22 mg/dL — AB (ref 6–20)
CHLORIDE: 103 mmol/L (ref 101–111)
CO2: 26 mmol/L (ref 22–32)
CREATININE: 1.08 mg/dL (ref 0.61–1.24)
Calcium: 9.6 mg/dL (ref 8.9–10.3)
GFR calc Af Amer: >60 mL/min (ref >60)
GFR calc non Af Amer: >60 mL/min (ref >60)
Glucose, Bld: 108 mg/dL — ABNORMAL HIGH (ref 65–99)
Potassium: 3.7 mmol/L (ref 3.5–5.1)
SODIUM: 138 mmol/L (ref 135–145)

## 2016-10-07 LAB — SURGICAL PCR SCREEN
MRSA, PCR: NEGATIVE
STAPHYLOCOCCUS AUREUS: NEGATIVE

## 2016-10-07 LAB — TYPE AND SCREEN
ABO/RH(D): O POS
Antibody Screen: NEGATIVE

## 2016-10-07 LAB — PROTIME-INR
INR: 0.91
PROTHROMBIN TIME: 12.2 s (ref 11.4–15.2)

## 2016-10-07 LAB — GLUCOSE, CAPILLARY: Glucose-Capillary: 156 mg/dL — ABNORMAL HIGH (ref 65–99)

## 2016-10-07 LAB — ABO/RH: ABO/RH(D): O POS

## 2016-10-07 NOTE — Progress Notes (Signed)
   10/07/16 1400  OBSTRUCTIVE SLEEP APNEA  Have you ever been diagnosed with sleep apnea through a sleep study? No  Do you snore loudly (loud enough to be heard through closed doors)?  1  Do you often feel tired, fatigued, or sleepy during the daytime (such as falling asleep during driving or talking to someone)? 0  Has anyone observed you stop breathing during your sleep? 1  Do you have, or are you being treated for high blood pressure? 1  BMI more than 35 kg/m2? 0  Age > 50 (1-yes) 1  Neck circumference greater than:Male 16 inches or larger, Male 17inches or larger? 1  Male Gender (Yes=1) 1  Obstructive Sleep Apnea Score 6  Score 5 or greater  Results sent to PCP

## 2016-10-07 NOTE — Pre-Procedure Instructions (Signed)
Walter Holland  10/07/2016      [facility] [address], [place] - [address] [place] [address] Phone: [phone] Fax: [phone]    Your procedure is scheduled on    Wednesday  10/09/16  Report to [facility] Admitting at 630 A.M.  Call this number if you have problems the morning of surgery:  [phone]   Remember:  Do not eat food or drink liquids after midnight.  Take these medicines the morning of surgery with A SIP OF WATER  NEBIVOLOL (BYSTOLIC), PROPANOLOL (INDERAL),GABAPENTIN, OMEPRAZOLE (PRILOSEC), TRAMADOL     (STOP MELOXICAM/ MOBIC, ASPIRIN OR ASPIRIN PRODUCTS, IBUPROFEN/ MOTRIN/ ADVIL, GOODY POWDERS/ BC'S, HERBAL MEDICINES)    How to Manage Your Diabetes Before and After Surgery  Why is it important to control my blood sugar before and after surgery? . Improving blood sugar levels before and after surgery helps healing and can limit problems. . A way of improving blood sugar control is eating a healthy diet by: o  Eating less sugar and carbohydrates o  Increasing activity/exercise o  Talking with your doctor about reaching your blood sugar goals . High blood sugars (greater than 180 mg/dL) can raise your risk of infections and slow your recovery, so you will need to focus on controlling your diabetes during the weeks before surgery. . Make sure that the doctor who takes care of your diabetes knows about your planned surgery including the date and location.  How do I manage my blood sugar before surgery? . Check your blood sugar at least 4 times a day, starting 2 days before surgery, to make sure that the level is not too high or low. o Check your blood sugar the morning of your surgery when you wake up and every 2 hours until you get to the Short Stay unit. . If your blood sugar is less than 70 mg/dL, you will need to treat for low blood sugar: o Do not take insulin. o Treat a low blood sugar (less than 70 mg/dL) with  cup  of clear juice (cranberry or apple), 4 glucose tablets, OR glucose gel. o Recheck blood sugar in 15 minutes after treatment (to make sure it is greater than 70 mg/dL). If your blood sugar is not greater than 70 mg/dL on recheck, call [phone] for further instructions. . Report your blood sugar to the short stay nurse when you get to Short Stay.  . If you are admitted to the hospital after surgery: o Your blood sugar will be checked by the staff and you will probably be given insulin after surgery (instead of oral diabetes medicines) to make sure you have good blood sugar levels. o The goal for blood sugar control after surgery is 80-180 mg/dL.              WHAT DO I DO ABOUT MY DIABETES MEDICATION?   Marland Kitchen Do not take oral diabetes medicines (pills) the morning of surgery.  . THE NIGHT BEFORE SURGERY, take __25________ units of ____LANTUS______insulin.       Marland Kitchen tHE MORNING OF SURGERY, take _________30____ units of _____LANTUS_____insulin.  . The day of surgery, do not take other diabetes injectables, including Byetta (exenatide), Bydureon (exenatide ER), Victoza (liraglutide), or Trulicity (dulaglutide).  . If your CBG is greater than 220 mg/dL, you may take  of your sliding scale (correction) dose of insulin.  Other Instructions:          Patient Signature:  Date:   Nurse Signature:  Date:   Reviewed and Endorsed by [facility] Patient Education Committee, August 2015  Do not wear jewelry, make-up or nail polish.  Do not wear lotions, powders, or perfumes, or deoderant.  Do not shave 48 hours prior to surgery.  Men may shave face and neck.  Do not bring valuables to the hospital.  [facility] is not responsible for any belongings or valuables.  Contacts, dentures or bridgework may not be worn into surgery.  Leave your suitcase in the car.  After surgery it may be brought to your room.  For patients admitted to the hospital, discharge time will be determined by  your treatment team.  Patients discharged the day of surgery will not be allowed to drive home.   Name and phone number of your driver:    Special instructions:  SEE PREPARING FOR SURGERY   Please read over the following fact sheets that you were given. MRSA Information and Surgical Site Infection Prevention

## 2016-10-07 NOTE — Progress Notes (Signed)
Faxed stop bang tool to Dr. Sherrie GeorgeJulia Holland [facility])

## 2016-10-08 LAB — HEMOGLOBIN A1C
Hgb A1c MFr Bld: 8.5 % — ABNORMAL HIGH (ref 4.8–5.6)
MEAN PLASMA GLUCOSE: 197 mg/dL

## 2016-10-08 NOTE — Anesthesia Preprocedure Evaluation (Addendum)
Anesthesia Evaluation  Patient identified by MRN, date of birth, ID band Patient awake    Reviewed: Allergy & Precautions, H&P , NPO status , Patient's Chart, lab work & pertinent test results, reviewed documented beta blocker date and time   History of Anesthesia Complications Negative for: history of anesthetic complications  Airway Mallampati: III  TM Distance: >3 FB Neck ROM: full    Dental no notable dental hx. (+) Teeth Intact, Poor Dentition, Caps, Dental Advisory Given   Pulmonary former smoker,    Pulmonary exam normal breath sounds clear to auscultation       Cardiovascular hypertension, Pt. on medications and Pt. on home beta blockers Normal cardiovascular exam Rhythm:Regular Rate:Normal     Neuro/Psych CVA, No Residual Symptoms    GI/Hepatic Neg liver ROS, GERD  Medicated and Controlled,  Endo/Other  diabetes, Type 2, Insulin Dependent, Oral Hypoglycemic Agentsobese  Renal/GU      Musculoskeletal  (+) Arthritis ,   Abdominal   Peds  Hematology negative hematology ROS (+)   Anesthesia Other Findings   Reproductive/Obstetrics                           Anesthesia Physical  Anesthesia Plan  ASA: III  Anesthesia Plan: General   Post-op Pain Management:    Induction: Intravenous  Airway Management Planned: Oral ETT  Additional Equipment:   Intra-op Plan:   Post-operative Plan: Extubation in OR  Informed Consent: I have reviewed the patients History and Physical, chart, labs and discussed the procedure including the risks, benefits and alternatives for the proposed anesthesia with the patient or authorized representative who has indicated his/her understanding and acceptance.     Plan Discussed with: CRNA and Surgeon  Anesthesia Plan Comments:        Anesthesia Quick Evaluation

## 2016-10-09 ENCOUNTER — Encounter (HOSPITAL_COMMUNITY): Payer: Self-pay | Admitting: Anesthesiology

## 2016-10-09 ENCOUNTER — Inpatient Hospital Stay (HOSPITAL_COMMUNITY): Payer: Worker's Compensation

## 2016-10-09 ENCOUNTER — Inpatient Hospital Stay (HOSPITAL_COMMUNITY): Payer: Worker's Compensation | Admitting: Anesthesiology

## 2016-10-09 ENCOUNTER — Encounter (HOSPITAL_COMMUNITY)
Admission: RE | Disposition: A | Discharge status: Home / Self Care | Payer: Self-pay | Source: Ambulatory Visit | Attending: Neurological Surgery

## 2016-10-09 ENCOUNTER — Inpatient Hospital Stay (HOSPITAL_COMMUNITY)
Admission: RE | Admit: 2016-10-09 | Discharge: 2016-10-10 | DRG: 460 | Disposition: A | Discharge status: Home / Self Care | Payer: Worker's Compensation | Source: Ambulatory Visit | Attending: Neurological Surgery | Admitting: Neurological Surgery

## 2016-10-09 DIAGNOSIS — M48061 Spinal stenosis, lumbar region without neurogenic claudication: Principal | ICD-10-CM | POA: Diagnosis present

## 2016-10-09 DIAGNOSIS — K219 Gastro-esophageal reflux disease without esophagitis: Secondary | ICD-10-CM | POA: Diagnosis present

## 2016-10-09 DIAGNOSIS — I10 Essential (primary) hypertension: Secondary | ICD-10-CM | POA: Diagnosis present

## 2016-10-09 DIAGNOSIS — Z794 Long term (current) use of insulin: Secondary | ICD-10-CM

## 2016-10-09 DIAGNOSIS — Z87891 Personal history of nicotine dependence: Secondary | ICD-10-CM

## 2016-10-09 DIAGNOSIS — Z981 Arthrodesis status: Secondary | ICD-10-CM

## 2016-10-09 DIAGNOSIS — M21372 Foot drop, left foot: Secondary | ICD-10-CM | POA: Diagnosis present

## 2016-10-09 DIAGNOSIS — Z23 Encounter for immunization: Secondary | ICD-10-CM

## 2016-10-09 DIAGNOSIS — M1990 Unspecified osteoarthritis, unspecified site: Secondary | ICD-10-CM | POA: Diagnosis present

## 2016-10-09 DIAGNOSIS — E119 Type 2 diabetes mellitus without complications: Secondary | ICD-10-CM | POA: Diagnosis present

## 2016-10-09 DIAGNOSIS — Z7982 Long term (current) use of aspirin: Secondary | ICD-10-CM

## 2016-10-09 DIAGNOSIS — Z79899 Other long term (current) drug therapy: Secondary | ICD-10-CM

## 2016-10-09 DIAGNOSIS — Z419 Encounter for procedure for purposes other than remedying health state, unspecified: Secondary | ICD-10-CM

## 2016-10-09 DIAGNOSIS — M545 Low back pain: Secondary | ICD-10-CM | POA: Diagnosis present

## 2016-10-09 DIAGNOSIS — Z8673 Personal history of transient ischemic attack (TIA), and cerebral infarction without residual deficits: Secondary | ICD-10-CM | POA: Diagnosis not present

## 2016-10-09 HISTORY — PX: MAXIMUM ACCESS (MAS)POSTERIOR LUMBAR INTERBODY FUSION (PLIF) 3 LEVEL: SHX6370

## 2016-10-09 LAB — GLUCOSE, CAPILLARY
GLUCOSE-CAPILLARY: 114 mg/dL — AB (ref 65–99)
GLUCOSE-CAPILLARY: 133 mg/dL — AB (ref 65–99)
Glucose-Capillary: 122 mg/dL — ABNORMAL HIGH (ref 65–99)
Glucose-Capillary: 239 mg/dL — ABNORMAL HIGH (ref 65–99)

## 2016-10-09 SURGERY — FOR MAXIMUM ACCESS (MAS) POSTERIOR LUMBAR INTERBODY FUSION (PLIF) 3 LEVEL
Anesthesia: General

## 2016-10-09 MED ORDER — PROPOFOL 10 MG/ML IV BOLUS
INTRAVENOUS | Status: AC
  Filled 2016-10-09: qty 20

## 2016-10-09 MED ORDER — DEXAMETHASONE SODIUM PHOSPHATE 10 MG/ML IJ SOLN
INTRAMUSCULAR | Status: AC
  Filled 2016-10-09: qty 1

## 2016-10-09 MED ORDER — TIZANIDINE HCL 4 MG PO TABS
4.0000 mg | ORAL_TABLET | Freq: Four times a day (QID) | ORAL | Status: DC | PRN
  Administered 2016-10-10 (×2): 4 mg via ORAL
  Filled 2016-10-09 (×4): qty 1

## 2016-10-09 MED ORDER — MORPHINE SULFATE (PF) 2 MG/ML IV SOLN
1.0000 mg | INTRAVENOUS | Status: DC | PRN
  Administered 2016-10-10 (×2): 2 mg via INTRAVENOUS
  Filled 2016-10-09 (×2): qty 1

## 2016-10-09 MED ORDER — NEBIVOLOL HCL 10 MG PO TABS
10.0000 mg | ORAL_TABLET | Freq: Every day | ORAL | Status: DC
  Administered 2016-10-10: 10 mg via ORAL
  Filled 2016-10-09: qty 1

## 2016-10-09 MED ORDER — MENTHOL 3 MG MT LOZG: 1.0000 | LOZENGE | OROMUCOSAL | Status: DC | PRN

## 2016-10-09 MED ORDER — GABAPENTIN 300 MG PO CAPS
300.0000 mg | ORAL_CAPSULE | Freq: Three times a day (TID) | ORAL | Status: DC
  Administered 2016-10-09 – 2016-10-10 (×3): 300 mg via ORAL
  Filled 2016-10-09 (×3): qty 1

## 2016-10-09 MED ORDER — CEFAZOLIN SODIUM-DEXTROSE 2-4 GM/100ML-% IV SOLN
INTRAVENOUS | Status: AC
  Filled 2016-10-09: qty 100

## 2016-10-09 MED ORDER — CEFAZOLIN IN D5W 1 GM/50ML IV SOLN
1.0000 g | Freq: Three times a day (TID) | INTRAVENOUS | Status: AC
  Administered 2016-10-09 – 2016-10-10 (×2): 1 g via INTRAVENOUS
  Filled 2016-10-09 (×2): qty 50

## 2016-10-09 MED ORDER — PROMETHAZINE HCL 25 MG/ML IJ SOLN: 6.2500 mg | INTRAMUSCULAR | Status: DC | PRN

## 2016-10-09 MED ORDER — CHLORHEXIDINE GLUCONATE CLOTH 2 % EX PADS: 6.0000 | MEDICATED_PAD | Freq: Once | CUTANEOUS | Status: DC

## 2016-10-09 MED ORDER — CANAGLIFLOZIN-METFORMIN HCL 150-500 MG PO TABS: 1.0000 | ORAL_TABLET | Freq: Every day | ORAL | Status: DC

## 2016-10-09 MED ORDER — SUCCINYLCHOLINE CHLORIDE 200 MG/10ML IV SOSY
PREFILLED_SYRINGE | INTRAVENOUS | Status: AC
  Filled 2016-10-09: qty 10

## 2016-10-09 MED ORDER — MIDAZOLAM HCL 2 MG/2ML IJ SOLN
INTRAMUSCULAR | Status: AC
  Filled 2016-10-09: qty 2

## 2016-10-09 MED ORDER — ROCURONIUM BROMIDE 100 MG/10ML IV SOLN
INTRAVENOUS | Status: DC | PRN
  Administered 2016-10-09: 40 mg via INTRAVENOUS
  Administered 2016-10-09: 20 mg via INTRAVENOUS

## 2016-10-09 MED ORDER — FENTANYL CITRATE (PF) 100 MCG/2ML IJ SOLN
INTRAMUSCULAR | Status: AC
  Filled 2016-10-09: qty 2

## 2016-10-09 MED ORDER — CELECOXIB 200 MG PO CAPS
200.0000 mg | ORAL_CAPSULE | Freq: Two times a day (BID) | ORAL | Status: DC
  Administered 2016-10-09 – 2016-10-10 (×3): 200 mg via ORAL
  Filled 2016-10-09 (×3): qty 1

## 2016-10-09 MED ORDER — POTASSIUM CHLORIDE IN NACL 20-0.9 MEQ/L-% IV SOLN
INTRAVENOUS | Status: DC
  Administered 2016-10-09 – 2016-10-10 (×2): via INTRAVENOUS
  Filled 2016-10-09 (×2): qty 1000

## 2016-10-09 MED ORDER — SODIUM CHLORIDE 0.9% FLUSH: 3.0000 mL | INTRAVENOUS | Status: DC | PRN

## 2016-10-09 MED ORDER — FENTANYL CITRATE (PF) 100 MCG/2ML IJ SOLN
INTRAMUSCULAR | Status: AC
  Filled 2016-10-09: qty 4

## 2016-10-09 MED ORDER — ASPIRIN 81 MG PO CHEW
81.0000 mg | CHEWABLE_TABLET | Freq: Every day | ORAL | Status: DC
  Administered 2016-10-09 – 2016-10-10 (×2): 81 mg via ORAL
  Filled 2016-10-09 (×2): qty 1

## 2016-10-09 MED ORDER — FENTANYL CITRATE (PF) 100 MCG/2ML IJ SOLN
INTRAMUSCULAR | Status: DC | PRN
  Administered 2016-10-09: 50 ug via INTRAVENOUS
  Administered 2016-10-09: 100 ug via INTRAVENOUS
  Administered 2016-10-09 (×4): 50 ug via INTRAVENOUS

## 2016-10-09 MED ORDER — 0.9 % SODIUM CHLORIDE (POUR BTL) OPTIME
TOPICAL | Status: DC | PRN
  Administered 2016-10-09: 1000 mL

## 2016-10-09 MED ORDER — SODIUM CHLORIDE 0.9 % IV SOLN
0.0125 ug/kg/min | INTRAVENOUS | Status: DC
  Filled 2016-10-09: qty 2000

## 2016-10-09 MED ORDER — LIDOCAINE 2% (20 MG/ML) 5 ML SYRINGE
INTRAMUSCULAR | Status: AC
  Filled 2016-10-09: qty 5

## 2016-10-09 MED ORDER — PROPOFOL 10 MG/ML IV BOLUS
INTRAVENOUS | Status: DC | PRN
  Administered 2016-10-09: 200 mg via INTRAVENOUS

## 2016-10-09 MED ORDER — METFORMIN HCL 500 MG PO TABS
1000.0000 mg | ORAL_TABLET | Freq: Two times a day (BID) | ORAL | Status: DC
  Administered 2016-10-09 – 2016-10-10 (×2): 1000 mg via ORAL
  Filled 2016-10-09 (×2): qty 2

## 2016-10-09 MED ORDER — THROMBIN 5000 UNITS EX SOLR
OROMUCOSAL | Status: DC | PRN
  Administered 2016-10-09: 10:00:00 via TOPICAL

## 2016-10-09 MED ORDER — PHENYLEPHRINE 40 MCG/ML (10ML) SYRINGE FOR IV PUSH (FOR BLOOD PRESSURE SUPPORT)
PREFILLED_SYRINGE | INTRAVENOUS | Status: AC
  Filled 2016-10-09: qty 10

## 2016-10-09 MED ORDER — ONDANSETRON HCL 4 MG/2ML IJ SOLN
INTRAMUSCULAR | Status: AC
  Filled 2016-10-09: qty 2

## 2016-10-09 MED ORDER — PHENYLEPHRINE HCL 10 MG/ML IJ SOLN
INTRAVENOUS | Status: DC | PRN
  Administered 2016-10-09: 10 ug/min via INTRAVENOUS

## 2016-10-09 MED ORDER — ACETAMINOPHEN 10 MG/ML IV SOLN
1000.0000 mg | INTRAVENOUS | Status: AC
  Administered 2016-10-09: 1000 mg via INTRAVENOUS
  Filled 2016-10-09: qty 100

## 2016-10-09 MED ORDER — INSULIN ASPART 100 UNIT/ML ~~LOC~~ SOLN
0.0000 [IU] | Freq: Every day | SUBCUTANEOUS | Status: DC
  Administered 2016-10-09: 2 [IU] via SUBCUTANEOUS

## 2016-10-09 MED ORDER — SODIUM CHLORIDE 0.9 % IR SOLN
Status: DC | PRN
  Administered 2016-10-09: 10:00:00

## 2016-10-09 MED ORDER — SUGAMMADEX SODIUM 200 MG/2ML IV SOLN
INTRAVENOUS | Status: DC | PRN
  Administered 2016-10-09: 200 mg via INTRAVENOUS

## 2016-10-09 MED ORDER — VANCOMYCIN HCL 1000 MG IV SOLR
INTRAVENOUS | Status: AC
  Filled 2016-10-09: qty 1000

## 2016-10-09 MED ORDER — OXYCODONE HCL 5 MG/5ML PO SOLN: 5.0000 mg | Freq: Once | ORAL | Status: DC | PRN

## 2016-10-09 MED ORDER — THROMBIN 5000 UNITS EX SOLR
CUTANEOUS | Status: AC
  Filled 2016-10-09: qty 5000

## 2016-10-09 MED ORDER — PROPRANOLOL HCL 40 MG PO TABS
40.0000 mg | ORAL_TABLET | Freq: Two times a day (BID) | ORAL | Status: DC
  Administered 2016-10-09 – 2016-10-10 (×2): 40 mg via ORAL
  Filled 2016-10-09 (×3): qty 1

## 2016-10-09 MED ORDER — CEFAZOLIN SODIUM 1 G IJ SOLR
INTRAMUSCULAR | Status: AC
  Filled 2016-10-09: qty 20

## 2016-10-09 MED ORDER — BUPIVACAINE HCL (PF) 0.25 % IJ SOLN
INTRAMUSCULAR | Status: AC
  Filled 2016-10-09: qty 30

## 2016-10-09 MED ORDER — MIDAZOLAM HCL 5 MG/5ML IJ SOLN
INTRAMUSCULAR | Status: DC | PRN
  Administered 2016-10-09: 2 mg via INTRAVENOUS

## 2016-10-09 MED ORDER — SODIUM CHLORIDE 0.9 % IV SOLN: 250.0000 mL | INTRAVENOUS | Status: DC

## 2016-10-09 MED ORDER — THROMBIN 20000 UNITS EX SOLR
CUTANEOUS | Status: DC | PRN
  Administered 2016-10-09: 10:00:00 via TOPICAL

## 2016-10-09 MED ORDER — CANAGLIFLOZIN 100 MG PO TABS
100.0000 mg | ORAL_TABLET | Freq: Every day | ORAL | Status: DC
  Administered 2016-10-10: 100 mg via ORAL
  Filled 2016-10-09: qty 1

## 2016-10-09 MED ORDER — ACETAMINOPHEN 325 MG PO TABS: 650.0000 mg | ORAL_TABLET | ORAL | Status: DC | PRN

## 2016-10-09 MED ORDER — BENAZEPRIL HCL 20 MG PO TABS
40.0000 mg | ORAL_TABLET | Freq: Every day | ORAL | Status: DC
  Administered 2016-10-09 – 2016-10-10 (×2): 40 mg via ORAL
  Filled 2016-10-09 (×2): qty 2

## 2016-10-09 MED ORDER — OXYCODONE-ACETAMINOPHEN 5-325 MG PO TABS: 1.0000 | ORAL_TABLET | ORAL | Status: DC | PRN

## 2016-10-09 MED ORDER — PANTOPRAZOLE SODIUM 40 MG PO TBEC
40.0000 mg | DELAYED_RELEASE_TABLET | Freq: Every day | ORAL | Status: DC
  Administered 2016-10-10: 40 mg via ORAL
  Filled 2016-10-09: qty 1

## 2016-10-09 MED ORDER — HYDROCHLOROTHIAZIDE 25 MG PO TABS
25.0000 mg | ORAL_TABLET | Freq: Every day | ORAL | Status: DC
  Administered 2016-10-09 – 2016-10-10 (×2): 25 mg via ORAL
  Filled 2016-10-09 (×2): qty 1

## 2016-10-09 MED ORDER — PHENOL 1.4 % MT LIQD: 1.0000 | OROMUCOSAL | Status: DC | PRN

## 2016-10-09 MED ORDER — THROMBIN 20000 UNITS EX SOLR
CUTANEOUS | Status: AC
  Filled 2016-10-09: qty 20000

## 2016-10-09 MED ORDER — ONDANSETRON HCL 4 MG/2ML IJ SOLN: 4.0000 mg | INTRAMUSCULAR | Status: DC | PRN

## 2016-10-09 MED ORDER — ACETAMINOPHEN 650 MG RE SUPP: 650.0000 mg | RECTAL | Status: DC | PRN

## 2016-10-09 MED ORDER — LACTATED RINGERS IV SOLN
INTRAVENOUS | Status: DC | PRN
  Administered 2016-10-09 (×2): via INTRAVENOUS

## 2016-10-09 MED ORDER — INSULIN ASPART 100 UNIT/ML ~~LOC~~ SOLN
0.0000 [IU] | Freq: Three times a day (TID) | SUBCUTANEOUS | Status: DC
  Administered 2016-10-09: 2 [IU] via SUBCUTANEOUS
  Administered 2016-10-10 (×2): 3 [IU] via SUBCUTANEOUS

## 2016-10-09 MED ORDER — BENAZEPRIL-HYDROCHLOROTHIAZIDE 20-12.5 MG PO TABS: 2.0000 | ORAL_TABLET | Freq: Every day | ORAL | Status: DC

## 2016-10-09 MED ORDER — VANCOMYCIN HCL 1000 MG IV SOLR
INTRAVENOUS | Status: DC | PRN
  Administered 2016-10-09: 1000 mg

## 2016-10-09 MED ORDER — BUPIVACAINE HCL (PF) 0.25 % IJ SOLN
INTRAMUSCULAR | Status: DC | PRN
  Administered 2016-10-09: 5 mL

## 2016-10-09 MED ORDER — FENTANYL CITRATE (PF) 100 MCG/2ML IJ SOLN
25.0000 ug | INTRAMUSCULAR | Status: DC | PRN
  Administered 2016-10-09 (×2): 50 ug via INTRAVENOUS

## 2016-10-09 MED ORDER — SODIUM CHLORIDE 0.9% FLUSH: 3.0000 mL | Freq: Two times a day (BID) | INTRAVENOUS | Status: DC

## 2016-10-09 MED ORDER — BUPIVACAINE LIPOSOME 1.3 % IJ SUSP
20.0000 mL | INTRAMUSCULAR | Status: AC
Start: 2016-10-09 — End: 2016-10-09
  Administered 2016-10-09: 20 mL
  Filled 2016-10-09: qty 20

## 2016-10-09 MED ORDER — DOCUSATE SODIUM 100 MG PO CAPS
100.0000 mg | ORAL_CAPSULE | Freq: Two times a day (BID) | ORAL | Status: DC
  Administered 2016-10-09 – 2016-10-10 (×3): 100 mg via ORAL
  Filled 2016-10-09 (×3): qty 1

## 2016-10-09 MED ORDER — SUGAMMADEX SODIUM 200 MG/2ML IV SOLN
INTRAVENOUS | Status: AC
  Filled 2016-10-09: qty 2

## 2016-10-09 MED ORDER — DEXAMETHASONE SODIUM PHOSPHATE 10 MG/ML IJ SOLN
10.0000 mg | INTRAMUSCULAR | Status: AC
  Administered 2016-10-09: 10 mg via INTRAVENOUS

## 2016-10-09 MED ORDER — CEFAZOLIN SODIUM-DEXTROSE 2-4 GM/100ML-% IV SOLN
2.0000 g | INTRAVENOUS | Status: AC
  Administered 2016-10-09 (×2): 2 g via INTRAVENOUS

## 2016-10-09 MED ORDER — SODIUM CHLORIDE 0.9 % IV SOLN
0.0125 ug/kg/min | INTRAVENOUS | Status: AC
  Administered 2016-10-09: .1 ug/kg/min via INTRAVENOUS
  Administered 2016-10-09: 13:00:00 via INTRAVENOUS
  Filled 2016-10-09: qty 2000

## 2016-10-09 MED ORDER — EPHEDRINE 5 MG/ML INJ
INTRAVENOUS | Status: AC
  Filled 2016-10-09: qty 10

## 2016-10-09 MED ORDER — ONDANSETRON HCL 4 MG/2ML IJ SOLN
INTRAMUSCULAR | Status: DC | PRN
  Administered 2016-10-09: 4 mg via INTRAVENOUS

## 2016-10-09 MED ORDER — LIDOCAINE HCL (CARDIAC) 20 MG/ML IV SOLN
INTRAVENOUS | Status: DC | PRN
  Administered 2016-10-09: 100 mg via INTRAVENOUS

## 2016-10-09 MED ORDER — LACTATED RINGERS IV SOLN
INTRAVENOUS | Status: DC | PRN
  Administered 2016-10-09 (×3): via INTRAVENOUS

## 2016-10-09 MED ORDER — OXYCODONE HCL 5 MG PO TABS: 5.0000 mg | ORAL_TABLET | Freq: Once | ORAL | Status: DC | PRN

## 2016-10-09 MED FILL — Sodium Chloride IV Soln 0.9%: INTRAVENOUS | Qty: 1000 | Status: AC

## 2016-10-09 MED FILL — Heparin Sodium (Porcine) Inj 1000 Unit/ML: INTRAMUSCULAR | Qty: 30 | Status: AC

## 2016-10-09 SURGICAL SUPPLY — 70 items
ADH SKN CLS APL DERMABOND .7 (GAUZE/BANDAGES/DRESSINGS) ×1
APL SKNCLS STERI-STRIP NONHPOA (GAUZE/BANDAGES/DRESSINGS) ×1
BAG DECANTER FOR FLEXI CONT (MISCELLANEOUS) ×2 IMPLANT
BENZOIN TINCTURE PRP APPL 2/3 (GAUZE/BANDAGES/DRESSINGS) ×2 IMPLANT
BIT DRILL PLIF MAS DISP 5.5MM (DRILL) IMPLANT
BLADE CLIPPER SURG (BLADE) IMPLANT
BONE CANC CHIPS 40CC CAN1/2 (Bone Implant) ×2 IMPLANT
BUR MATCHSTICK NEURO 3.0 LAGG (BURR) ×2 IMPLANT
CAGE COROENT MP 11X23X9 (Cage) ×2 IMPLANT
CAGE COROENT MP 8X9X23M-8 SPIN (Cage) ×2 IMPLANT
CANISTER SUCT 3000ML PPV (MISCELLANEOUS) ×2 IMPLANT
CAP RELINE MOD TULIP RMM (Cap) ×1 IMPLANT
CHIPS CANC BONE 40CC CAN1/2 (Bone Implant) ×1 IMPLANT
CLIP NEUROVISION LG (CLIP) ×1 IMPLANT
CONT SPEC 4OZ CLIKSEAL STRL BL (MISCELLANEOUS) ×2 IMPLANT
COVER BACK TABLE 24X17X13 BIG (DRAPES) IMPLANT
COVER BACK TABLE 60X90IN (DRAPES) ×2 IMPLANT
DERMABOND ADVANCED (GAUZE/BANDAGES/DRESSINGS) ×1
DERMABOND ADVANCED .7 DNX12 (GAUZE/BANDAGES/DRESSINGS) IMPLANT
DRAPE C-ARM 42X72 X-RAY (DRAPES) ×2 IMPLANT
DRAPE C-ARMOR (DRAPES) ×2 IMPLANT
DRAPE LAPAROTOMY 100X72X124 (DRAPES) ×2 IMPLANT
DRAPE POUCH INSTRU U-SHP 10X18 (DRAPES) ×2 IMPLANT
DRAPE SURG 17X23 STRL (DRAPES) ×2 IMPLANT
DRILL PLIF MAS DISP 5.5MM (DRILL) ×2
DRSG OPSITE POSTOP 4X8 (GAUZE/BANDAGES/DRESSINGS) ×1 IMPLANT
DURAPREP 26ML APPLICATOR (WOUND CARE) ×2 IMPLANT
ELECT REM PT RETURN 9FT ADLT (ELECTROSURGICAL) ×2
ELECTRODE REM PT RTRN 9FT ADLT (ELECTROSURGICAL) ×1 IMPLANT
EVACUATOR 1/8 PVC DRAIN (DRAIN) ×1 IMPLANT
GAUZE SPONGE 4X4 16PLY XRAY LF (GAUZE/BANDAGES/DRESSINGS) IMPLANT
GLOVE BIO SURGEON STRL SZ8 (GLOVE) ×4 IMPLANT
GOWN STRL REUS W/ TWL LRG LVL3 (GOWN DISPOSABLE) IMPLANT
GOWN STRL REUS W/ TWL XL LVL3 (GOWN DISPOSABLE) ×2 IMPLANT
GOWN STRL REUS W/TWL 2XL LVL3 (GOWN DISPOSABLE) IMPLANT
GOWN STRL REUS W/TWL LRG LVL3 (GOWN DISPOSABLE)
GOWN STRL REUS W/TWL XL LVL3 (GOWN DISPOSABLE) ×4
GRAFT BNE CHIP CANC 1-8 40 (Bone Implant) IMPLANT
HEMOSTAT POWDER KIT SURGIFOAM (HEMOSTASIS) ×1 IMPLANT
KIT BASIN OR (CUSTOM PROCEDURE TRAY) ×2 IMPLANT
KIT INFUSE SMALL (Orthopedic Implant) ×1 IMPLANT
KIT ROOM TURNOVER OR (KITS) ×2 IMPLANT
MODULE NVM5 NEXT GEN EMG (NEEDLE) ×1 IMPLANT
NDL HYPO 21X1.5 SAFETY (NEEDLE) IMPLANT
NDL HYPO 25X1 1.5 SAFETY (NEEDLE) ×1 IMPLANT
NEEDLE HYPO 21X1.5 SAFETY (NEEDLE) ×2 IMPLANT
NEEDLE HYPO 25X1 1.5 SAFETY (NEEDLE) ×2 IMPLANT
NS IRRIG 1000ML POUR BTL (IV SOLUTION) ×2 IMPLANT
PACK LAMINECTOMY NEURO (CUSTOM PROCEDURE TRAY) ×2 IMPLANT
PAD ARMBOARD 7.5X6 YLW CONV (MISCELLANEOUS) ×6 IMPLANT
PEEK COROENT ANT TLIF 9X9X30 (Cage) ×1 IMPLANT
ROD RELINE O COCR 5.0X95MM (Rod) ×2 IMPLANT
SCREW LOCK RSS 4.5/5.0MM (Screw) ×8 IMPLANT
SCREW RELINE RM 6.5X30MM 4S (Screw) ×1 IMPLANT
SCREW RELINE RMM 5.5X35 4S (Screw) ×6 IMPLANT
SCREW SHANK RELINE MOD 5.5X35 (Screw) ×1 IMPLANT
SPONGE LAP 4X18 X RAY DECT (DISPOSABLE) ×1 IMPLANT
SPONGE SURGIFOAM ABS GEL 100 (HEMOSTASIS) ×2 IMPLANT
STRIP CLOSURE SKIN 1/2X4 (GAUZE/BANDAGES/DRESSINGS) ×4 IMPLANT
SUT VIC AB 0 CT1 18XCR BRD8 (SUTURE) ×1 IMPLANT
SUT VIC AB 0 CT1 8-18 (SUTURE) ×2
SUT VIC AB 2-0 CP2 18 (SUTURE) ×2 IMPLANT
SUT VIC AB 3-0 SH 8-18 (SUTURE) ×5 IMPLANT
SYR 3ML LL SCALE MARK (SYRINGE) IMPLANT
SYRINGE 20CC LL (MISCELLANEOUS) ×1 IMPLANT
TOWEL OR 17X24 6PK STRL BLUE (TOWEL DISPOSABLE) ×2 IMPLANT
TOWEL OR 17X26 10 PK STRL BLUE (TOWEL DISPOSABLE) ×2 IMPLANT
TRAP SPECIMEN MUCOUS 40CC (MISCELLANEOUS) ×1 IMPLANT
TRAY FOLEY W/METER SILVER 16FR (SET/KITS/TRAYS/PACK) ×2 IMPLANT
WATER STERILE IRR 1000ML POUR (IV SOLUTION) ×2 IMPLANT

## 2016-10-09 NOTE — Anesthesia Procedure Notes (Signed)
Procedure Name: Intubation Date/Time: 10/09/2016 8:43 AM Performed by: Lovie CholOCK, Jovonni Borquez K Pre-anesthesia Checklist: Patient identified, Emergency Drugs available, Suction available and Patient being monitored Patient Re-evaluated:Patient Re-evaluated prior to inductionOxygen Delivery Method: Circle System Utilized Preoxygenation: Pre-oxygenation with 100% oxygen Intubation Type: IV induction Ventilation: Mask ventilation without difficulty Laryngoscope Size: Miller and 2 Grade View: Grade III Tube type: Oral Tube size: 7.5 mm Number of attempts: 1 Airway Equipment and Method: Oral airway and Bougie stylet Placement Confirmation: ETT inserted through vocal cords under direct vision,  positive ETCO2 and breath sounds checked- equal and bilateral Secured at: 21.5 cm Tube secured with: Tape Dental Injury: Teeth and Oropharynx as per pre-operative assessment  Comments: Smooth IV induction. EZ mask. DL x 1 Mil 2. Grade 3 view. Unable to pass ETT. ETT passed easily over Bougie stylet. +ETCO2. bbse

## 2016-10-09 NOTE — Progress Notes (Signed)
Offer pain medication, pt refused and verbalized that he will let staff know if he needs pain medication. Will continue to monitor.  Sim BoastHavy, RN

## 2016-10-09 NOTE — Progress Notes (Signed)
RN offered to ambulate patient but he declined at this time.  Sim BoastHavy, RN

## 2016-10-09 NOTE — Op Note (Signed)
10/09/2016  1:41 PM  PATIENT:  Walter Holland  60 y.o. 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 Decompressive lumbar laminectomy medial facetectomy and foraminotomy L2-3 on the left and L3-4 and L4-5 bilaterally requiring more work than would be required for a simple exposure of the disk for PLIF in order to adequately decompress the neural elements and address the severe spinal stenosis 2. Posterior lumbar interbody fusion L3-4 and L4-5 using PEEK interbody cages packed with morcellized allograft and autograft, with right transforaminal interbody fusion L2-3 with a peek interbody cage packed with morselized autograft and allograft 3. Posterior fixation L2-L5 inclusive using cortical pedicle screws.  4. Intertransverse arthrodesis L2-L5 using morcellized autograft and allograft.  SURGEON:  Marikay Alaravid Ruey Storer, MD  ASSISTANTS: Dr. Jeral FruitBotero  ANESTHESIA:  General  EBL: 150 ml  Total I/O In: 3000 [I.V.:3000] Out: 375 [Urine:225; Blood:150]  BLOOD ADMINISTERED:none  DRAINS: None  INDICATION FOR PROCEDURE: This patient underwent a previous left-sided decompression and discectomy by another surgeon. He presented with continued back and left leg pain. 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 MRI showed severe spinal stenosis L2-3 L3-4 L4-5. He tried medical management without relief. I recommended decompression and instrumented fusion to address his severe spinal stenosis and failed back syndrome. Patient understood the risks, benefits, and alternatives and potential outcomes and wished to proceed.  PROCEDURE DETAILS:  The patient was brought to the operating room. After induction of generalized endotracheal anesthesia the patient was rolled into the prone position on chest rolls and all pressure points were padded. The patient's lumbar region was cleaned and then  prepped with DuraPrep and draped in the usual sterile fashion. Anesthesia was injected and then a dorsal midline incision was made and carried down to the lumbosacral fascia. The fascia was opened and the paraspinous musculature was taken down in a subperiosteal fashion to expose L2-L5 bilaterally. A self-retaining retractor was placed. Intraoperative fluoroscopy confirmed my level, and I started with placement of the L2 cortical pedicle screws. The pedicle screw entry zones were identified utilizing surface landmarks and  AP and lateral fluoroscopy. I scored the cortex with the high-speed drill and then used the hand drill and EMG monitoring to drill an upward and outward direction into the pedicle. I then tapped line to line, and the tap was also monitored. I then placed a 5.5 x 35 mm cortical pedicle screw into the pedicles of L2 bilaterally. I then turned my attention to the decompression and lumbar hemilaminectomies, hemi- facetectomies, and foraminotomies were performed at L3-4 and L4-5 bilaterally and at L2-3 on the right. The patient had significant spinal stenosis and this required more work than would be required for a simple exposure of the disc for posterior lumbar interbody fusion. Much more generous decompression was undertaken in order to adequately decompress the neural elements and address the patient's leg pain. At L4-5 there was significant epidural fibrosis and great care was taken to decompress the L5 nerve root and remove as much of the residual and recurrent disc herniation as possible . The yellow ligament was removed to expose the underlying dura and nerve roots, and generous foraminotomies were performed to adequately decompress the neural elements. Both the exiting and traversing nerve roots were decompressed on both sides until a coronary dilator passed easily along the nerve roots. Once the decompression was complete, I turned my  attention to the posterior lower lumbar interbody fusion  at L3-4 and L4-5 and a transforaminal interbody fusion at L2-3 from the right. The epidural venous vasculature was coagulated and cut sharply. Disc space was incised and the initial discectomy was performed with pituitary rongeurs. The disc space was distracted at all 3 levels with sequential distractors to a height of 10 mm. We then used a series of scrapers and shavers to prepare the endplates for fusion. The midline was prepared with Epstein curettes. Once the complete discectomy was finished at all 3 levels, we packed an appropriate sized peek interbody cage with local autograft and morcellized allograft, gently retracted the nerve root, and tapped the cage into position at L3-4 and L4-5 bilaterally and at L2-3 from the right.  The midline between the cages at L3-4 and L4-5 was packed with morselized autograft and allograft. At L2-3 I packed both anterior and posterior to the interbody cage. We then turned our attention to the placement of the lower pedicle screws from L3-L5 inclusive. The pedicle screw entry zones were identified utilizing surface landmarks and fluoroscopy. I drilled into each pedicle utilizing the hand drill and EMG monitoring, and tapped each pedicle with the appropriate tap. We palpated with a ball probe to assure no break in the cortex. We then placed 5.5 x 35 mm cortical pedicle screws into the pedicles bilaterally at L3-L5 bilaterally. We then decorticated the transverse processes and laid a mixture of morcellized autograft and allograft out over these to perform intertransverse arthrodesis at L2-L5. We then placed lordotic rods into the multiaxial screw heads of the pedicle screws and locked these in position with the locking caps and anti-torque device. We then checked our construct with AP and lateral fluoroscopy. Irrigated with copious amounts of bacitracin-containing saline solution.  Inspected the nerve roots once again to assure adequate decompression, placed vancomycin powder,  lined to the dura with Gelfoam, and closed the muscle and the fascia with 0 Vicryl. Closed the subcutaneous tissues with 2-0 Vicryl and subcuticular tissues with 3-0 Vicryl. The skin was closed with benzoin and Steri-Strips. Dressing was then applied, the patient was awakened from general anesthesia and transported to the recovery room in stable condition. At the end of the procedure all sponge, needle and instrument counts were correct.   PLAN OF CARE: Admit to inpatient   PATIENT DISPOSITION:  PACU - hemodynamically stable.   Delay start of Pharmacological VTE agent (>24hrs) due to surgical blood loss or risk of bleeding:  yes

## 2016-10-09 NOTE — Transfer of Care (Signed)
Immediate Anesthesia Transfer of Care Note  Patient: Walter Holland  Procedure(s) Performed: Procedure(s): Lumbar two-Lumbar three Transforaminal lumbar interbody fusion;  Lumbar three-Lumbar four - Lumbar four-Lumbar five MAXIMUM ACCESS (MAS) POSTERIOR LUMBAR INTERBODY FUSION (PLIF)   (N/A)  Patient Location: PACU  Anesthesia Type:General  Level of Consciousness: awake, oriented and patient cooperative  Airway & Oxygen Therapy: Patient Spontanous Breathing and Patient connected to face mask oxygen  Post-op Assessment: Report given to RN and Post -op Vital signs reviewed and stable  Post vital signs: Reviewed  Last Vitals:  Vitals:   10/09/16 0731 10/09/16 1406  BP: (!) 136/59   Pulse: (!) 56   Resp: 20   Temp: 36.6 C (P) 36.2 C    Last Pain:  Vitals:   10/09/16 1406  TempSrc:   PainSc: (P) Asleep      Patients Stated Pain Goal: 3 (10/09/16 0729)  Complications: No apparent anesthesia complications

## 2016-10-09 NOTE — Anesthesia Postprocedure Evaluation (Signed)
Anesthesia Post Note  Patient: Walter Holland  Procedure(s) Performed: Procedure(s) (LRB): Lumbar two-Lumbar three Transforaminal lumbar interbody fusion;  Lumbar three-Lumbar four - Lumbar four-Lumbar five MAXIMUM ACCESS (MAS) POSTERIOR LUMBAR INTERBODY FUSION (PLIF)   (N/A)  Patient location during evaluation: PACU Anesthesia Type: General Level of consciousness: awake and alert Pain management: pain level controlled Vital Signs Assessment: post-procedure vital signs reviewed and stable Respiratory status: spontaneous breathing, nonlabored ventilation, respiratory function stable and patient connected to nasal cannula oxygen Cardiovascular status: blood pressure returned to baseline and stable Postop Assessment: no signs of nausea or vomiting Anesthetic complications: no    Last Vitals:  Vitals:   10/09/16 1522 10/09/16 1538  BP:  133/67  Pulse:  81  Resp:  15  Temp: 36.4 C 36.7 C    Last Pain:  Vitals:   10/09/16 1538  TempSrc: Oral  PainSc:                  Bonita Quinichard S Asees Manfredi

## 2016-10-09 NOTE — H&P (Signed)
Subjective: Patient is a 60 y.o. male admitted for left leg pain. Onset of symptoms was several years ago, gradually worsening since that time.  The pain is rated severe, and is located at the across the lower back and radiates to LLE, S/P L L2-3 and L4-5 diskectomy by Ophelia CharterYates. 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 The pain is described as aching and occurs all day. The symptoms have been progressive. Symptoms are exacerbated by exercise. MRI or CT showed stenosis.   Past Medical History:  Diagnosis Date  . Arthritis   . Diabetes mellitus without complication (HCC)   . GERD (gastroesophageal reflux disease)   . Hypertension   . Stroke (HCC)    3 YRS  [place]     Past Surgical History:  Procedure Laterality Date  . LUMBAR LAMINECTOMY Left 04/05/2013   Procedure: MICRODISCECTOMY LUMBAR LAMINECTOMY;  Surgeon: Eldred MangesMark C Yates, MD;  [facility];  Service: Orthopedics;  Laterality: Left;  Left L2-3, L4-5 Decompression, Microdiscectomy, hemi-laminectomy    Prior to Admission medications   Medication Sig Start Date End Date Taking? Authorizing Provider  aspirin 81 MG chewable tablet Chew 81 mg by mouth daily.   Yes Historical Provider, MD  benazepril-hydrochlorthiazide (LOTENSIN HCT) 20-12.5 MG per tablet Take 2 tablets by mouth daily.    Yes Historical Provider, MD  gabapentin (NEURONTIN) 300 MG capsule Take 300 mg by mouth 3 (three) times daily.   Yes Historical Provider, MD  insulin aspart (NOVOLOG) 100 UNIT/ML injection Inject 15-30 Units into the skin 3 (three) times daily with meals. Take 15 unit every morning  Take 30 units at lunch Take 15 units every evening   Yes Historical Provider, MD  insulin glargine (LANTUS) 100 UNIT/ML injection Inject 50-60 Units into the skin 2 (two) times daily. Take 60 units every morning Take 50 units every evening   Yes Historical Provider, MD  INVOKAMET 150-500 MG TABS Take 1 tablet by mouth daily. 07/01/16  Yes Historical Provider, MD  meloxicam (MOBIC) 15  MG tablet Take 15 mg by mouth daily.   Yes Historical Provider, MD  metFORMIN (GLUCOPHAGE) 1000 MG tablet Take 1,000 mg by mouth 2 (two) times daily with a meal.   Yes Historical Provider, MD  nebivolol (BYSTOLIC) 10 MG tablet Take 10 mg by mouth daily.   Yes Historical Provider, MD  omeprazole (PRILOSEC) 20 MG capsule Take 20 mg by mouth 2 (two) times daily.   Yes Historical Provider, MD  propranolol (INDERAL) 40 MG tablet Take 40 mg by mouth 2 (two) times daily. 07/02/16  Yes Historical Provider, MD  simvastatin (ZOCOR) 10 MG tablet Take 10 mg by mouth daily.   Yes Historical Provider, MD  tiZANidine (ZANAFLEX) 4 MG tablet Take 4 mg by mouth every 6 (six) hours as needed (for muscle pain).   Yes Historical Provider, MD  traMADol (ULTRAM) 50 MG tablet Take 50 mg by mouth 4 (four) times daily.   Yes Historical Provider, MD  oxyCODONE-acetaminophen (ROXICET) 5-325 MG per tablet Take 1-2 tablets by mouth every 4 (four) hours as needed for pain. Patient not taking: Reported on 10/07/2016 04/05/13   Maud DeedSheila Vernon, PA-C   Allergies  Allergen Reactions  . No Known Allergies     Social History  Substance Use Topics  . Smoking status: Former Games developermoker  . Smokeless tobacco: Former NeurosurgeonUser    Quit date: 04/02/1993  . Alcohol use 7.2 oz/week    12 Cans of beer per week     Comment:  social-rare    History reviewed. No pertinent family history.   Review of Systems  Positive ROS: neg  All other systems have been reviewed and were otherwise negative with the exception of those mentioned in the HPI and as above.  Objective: Vital signs in last 24 hours: Temp:  [97.9 F (36.6 C)] 97.9 F (36.6 C) (10/18 0731) Pulse Rate:  [56] 56 (10/18 0731) Resp:  [20] 20 (10/18 0731) BP: (136)/(59) 136/59 (10/18 0731) SpO2:  [98 %] 98 % (10/18 0731) Weight:  [96.2 kg (212 lb)-96.4 kg (212 lb 8 oz)] 96.2 kg (212 lb) (10/18 0731)  General Appearance: Alert, cooperative, no distress, appears stated age Head:  Normocephalic, without obvious abnormality, atraumatic Eyes: PERRL, conjunctiva/corneas clear, EOM's intact    Neck: Supple, symmetrical, trachea midline Back: Symmetric, no curvature, ROM normal, no CVA tenderness Lungs:  respirations unlabored Heart: Regular rate and rhythm Abdomen: Soft, non-tender Extremities: Extremities normal, atraumatic, no cyanosis or edema Pulses: 2+ and symmetric all extremities Skin: Skin color, texture, turgor normal, no rashes or lesions  NEUROLOGIC:   Mental status: Alert and oriented x4,  no aphasia, good attention span, fund of knowledge, and memory Motor Exam - grossly normal except old complete L foot drop Sensory Exam - grossly normal Reflexes: 1+ Coordination - grossly normal Gait - not tested Balance - not tested Cranial Nerves: I: smell Not tested  II: visual acuity  OS: nl    OD: nl  II: visual fields Full to confrontation  II: pupils Equal, round, reactive to light  III,VII: ptosis None  III,IV,VI: extraocular muscles  Full ROM  V: mastication Normal  V: facial light touch sensation  Normal  V,VII: corneal reflex  Present  VII: facial muscle function - upper  Normal  VII: facial muscle function - lower Normal  VIII: hearing Not tested  IX: soft palate elevation  Normal  IX,X: gag reflex Present  XI: trapezius strength  5/5  XI: sternocleidomastoid strength 5/5  XI: neck flexion strength  5/5  XII: tongue strength  Normal    Data Review Lab Results  Component Value Date   WBC 10.1 10/07/2016   HGB 16.0 10/07/2016   HCT 46.6 10/07/2016   MCV 89.1 10/07/2016   PLT 247 10/07/2016   Lab Results  Component Value Date   NA 138 10/07/2016   K 3.7 10/07/2016   CL 103 10/07/2016   CO2 26 10/07/2016   BUN 22 (H) 10/07/2016   CREATININE 1.08 10/07/2016   GLUCOSE 108 (H) 10/07/2016   Lab Results  Component Value Date   INR 0.91 10/07/2016    Assessment/Plan: Patient admitted for PLIF L2-3 L3-4 L4-5. Patient has failed a  reasonable attempt at conservative therapy.  I explained the condition and procedure to the patient and answered any questions.  Patient wishes to proceed with procedure as planned. Understands risks/ benefits and typical outcomes of procedure.   Walter Holland S 10/09/2016 8:26 AM

## 2016-10-10 LAB — GLUCOSE, CAPILLARY
Glucose-Capillary: 182 mg/dL — ABNORMAL HIGH (ref 65–99)
Glucose-Capillary: 178 mg/dL — ABNORMAL HIGH (ref 65–99)

## 2016-10-10 MED ORDER — PNEUMOCOCCAL VAC POLYVALENT 25 MCG/0.5ML IJ INJ
0.5000 mL | INJECTION | INTRAMUSCULAR | Status: AC
  Administered 2016-10-10: 0.5 mL via INTRAMUSCULAR
  Filled 2016-10-10: qty 0.5

## 2016-10-10 MED ORDER — OXYCODONE-ACETAMINOPHEN 5-325 MG PO TABS: 1.0000 | ORAL_TABLET | Freq: Four times a day (QID) | ORAL | 0 refills | Status: AC | PRN

## 2016-10-10 MED ORDER — INFLUENZA VAC SPLIT QUAD 0.5 ML IM SUSY
0.5000 mL | PREFILLED_SYRINGE | INTRAMUSCULAR | Status: AC
  Administered 2016-10-10: 0.5 mL via INTRAMUSCULAR
  Filled 2016-10-10: qty 0.5

## 2016-10-10 NOTE — Care Management Note (Signed)
Case Management Note  Patient Details  Name: Walter Holland MRN: [id number] Date of Birth: [birth year]  Subjective/Objective:                    Action/Plan: Pt discharging home with orders for 3 in 1 and walker. CM spoke to patients [employer] Rep Arneta ClicheMark Strole [phone] and found that [employer] will be providing the equipment. CM called [employer] [phone]) and provided them the information requested and faxed [phone] the orders for the equipment. [employer] is going to have the equipment delivered to the patients home. CM updated Mr Walter Mccallumvalos.   Expected Discharge Date:                  Expected Discharge Plan:  Home/Self Care  In-House Referral:     Discharge planning Services  CM Consult  Post Acute Care Choice:  Durable Medical Equipment Choice offered to:  Patient  DME Arranged:  3-N-1, Walker rolling DME Agency:  Other - Comment ([employer]-Workers compensation)  HH Arranged:    HH Agency:     Status of Service:  Completed, signed off  If discussed at [employer] Meetings, dates discussed:    Additional Comments:  Kermit BaloKelli F Scottlynn Lindell, RN 10/10/2016, 1:50 PM

## 2016-10-10 NOTE — Discharge Summary (Signed)
Physician Discharge Summary  Patient ID: Walter Holland MRN: [id number] DOB/AGE: 1956-11-07 60 y.o.  Admit date: 10/09/2016 Discharge date: 10/10/2016  Admission Diagnoses: lumbar stenosis    Discharge Diagnoses: same   Discharged Condition: good  Hospital Course: The patient was admitted on 10/09/2016 and taken to the operating room where the patient underwent PLIF L2-3, L3-4, L4-5. The patient tolerated the procedure well and was taken to the recovery room and then to the floor in stable condition. The hospital course was routine. There were no complications. The wound remained clean dry and intact. Pt had appropriate back soreness. No complaints of leg pain or new N/T/W. The patient remained afebrile with stable vital signs, and tolerated a regular diet. The patient continued to increase activities, and pain was well controlled with oral pain medications.   Consults: None  Significant Diagnostic Studies:  Results for orders placed or performed during the hospital encounter of 10/09/16  Glucose, capillary  Result Value Ref Range   Glucose-Capillary 122 (H) 65 - 99 mg/dL   Comment 1 Notify RN   Glucose, capillary  Result Value Ref Range   Glucose-Capillary 114 (H) 65 - 99 mg/dL   Comment 1 Notify RN    Comment 2 Document in Chart   Glucose, capillary  Result Value Ref Range   Glucose-Capillary 133 (H) 65 - 99 mg/dL  Glucose, capillary  Result Value Ref Range   Glucose-Capillary 239 (H) 65 - 99 mg/dL  Glucose, capillary  Result Value Ref Range   Glucose-Capillary 182 (H) 65 - 99 mg/dL   Comment 1 Notify RN    Comment 2 Document in Chart   Glucose, capillary  Result Value Ref Range   Glucose-Capillary 178 (H) 65 - 99 mg/dL    Chest 2 View  Result Date: 10/07/2016 CLINICAL DATA:  Lumbar spinal stenosis.  Preop respiratory exam. EXAM: CHEST  2 VIEW COMPARISON:  04/02/2013 FINDINGS: The heart size and mediastinal contours are within normal limits. Both lungs are clear.  The visualized skeletal structures are unremarkable. IMPRESSION: No active cardiopulmonary disease. Electronically Signed   By: Myles RosenthalJohn  Stahl M.D.   On: 10/07/2016 17:21   Dg Lumbar Spine 2-3 Views  Result Date: 10/09/2016 CLINICAL DATA:  Posterior fusion EXAM: DG C-ARM 61-120 MIN; LUMBAR SPINE - 2-3 VIEW COMPARISON:  07/08/2016 FINDINGS: Three views of the lumbar spine submitted. There is posterior fusion with transpedicular screws and metallic rods at L2, L3-L4 and L5 level. There is anatomic alignment. Postsurgical disc spacer material noted at L2-L3, L3-L4 and L4-L5 level. Fluoroscopy time was 1 minutes 57 seconds. Please see the operative report. IMPRESSION: Posterior fusion L2-L5 level with anatomic alignment. Electronically Signed   By: Natasha MeadLiviu  Pop M.D.   On: 10/09/2016 14:55   Dg C-arm 61-120 Min  Result Date: 10/09/2016 CLINICAL DATA:  Posterior fusion EXAM: DG C-ARM 61-120 MIN; LUMBAR SPINE - 2-3 VIEW COMPARISON:  07/08/2016 FINDINGS: Three views of the lumbar spine submitted. There is posterior fusion with transpedicular screws and metallic rods at L2, L3-L4 and L5 level. There is anatomic alignment. Postsurgical disc spacer material noted at L2-L3, L3-L4 and L4-L5 level. Fluoroscopy time was 1 minutes 57 seconds. Please see the operative report. IMPRESSION: Posterior fusion L2-L5 level with anatomic alignment. Electronically Signed   By: Natasha MeadLiviu  Pop M.D.   On: 10/09/2016 14:55    Antibiotics:  Anti-infectives    Start     Dose/Rate Route Frequency Ordered Stop   10/09/16 2100  ceFAZolin (ANCEF) IVPB 1 g/50 mL  premix     1 g 100 mL/hr over 30 Minutes Intravenous Every 8 hours 10/09/16 1544 10/10/16 0625   10/09/16 1320  vancomycin (VANCOCIN) powder  Status:  Discontinued       As needed 10/09/16 1320 10/09/16 1359   10/09/16 0941  bacitracin 50,000 Units in sodium chloride irrigation 0.9 % 500 mL irrigation  Status:  Discontinued       As needed 10/09/16 0942 10/09/16 1359   10/09/16  0716  ceFAZolin (ANCEF) IVPB 2g/100 mL premix     2 g 200 mL/hr over 30 Minutes Intravenous On call to O.R. 10/09/16 0716 10/09/16 1250   10/09/16 0614  ceFAZolin (ANCEF) 2-4 GM/100ML-% IVPB    Comments:  Scronce, Trina   : cabinet override      10/09/16 0614 10/09/16 0850      Discharge Exam: Blood pressure 134/60, pulse 60, temperature 98.7 F (37.1 C), temperature source Oral, resp. rate 20, height 5\' 6"  (1.676 m), weight 96.2 kg (212 lb), SpO2 100 %. Neurologic: Grossly normal except old L foot drop Dressing dry  Discharge Medications:     Medication List    STOP taking these medications   meloxicam 15 MG tablet Commonly known as:  MOBIC     TAKE these medications   aspirin 81 MG chewable tablet Chew 81 mg by mouth daily.   benazepril-hydrochlorthiazide 20-12.5 MG tablet Commonly known as:  LOTENSIN HCT Take 2 tablets by mouth daily.   gabapentin 300 MG capsule Commonly known as:  NEURONTIN Take 300 mg by mouth 3 (three) times daily.   insulin aspart 100 UNIT/ML injection Commonly known as:  novoLOG Inject 15-30 Units into the skin 3 (three) times daily with meals. Take 15 unit every morning  Take 30 units at lunch Take 15 units every evening   insulin glargine 100 UNIT/ML injection Commonly known as:  LANTUS Inject 50-60 Units into the skin 2 (two) times daily. Take 60 units every morning Take 50 units every evening   INVOKAMET 150-500 MG Tabs Generic drug:  Canagliflozin-Metformin HCl Take 1 tablet by mouth daily.   metFORMIN 1000 MG tablet Commonly known as:  GLUCOPHAGE Take 1,000 mg by mouth 2 (two) times daily with a meal.   nebivolol 10 MG tablet Commonly known as:  BYSTOLIC Take 10 mg by mouth daily.   omeprazole 20 MG capsule Commonly known as:  PRILOSEC Take 20 mg by mouth 2 (two) times daily.   oxyCODONE-acetaminophen 5-325 MG tablet Commonly known as:  ROXICET Take 1-2 tablets by mouth every 6 (six) hours as needed for moderate pain. What  changed:  when to take this  reasons to take this   propranolol 40 MG tablet Commonly known as:  INDERAL Take 40 mg by mouth 2 (two) times daily.   simvastatin 10 MG tablet Commonly known as:  ZOCOR Take 10 mg by mouth daily.   tiZANidine 4 MG tablet Commonly known as:  ZANAFLEX Take 4 mg by mouth every 6 (six) hours as needed (for muscle pain).   traMADol 50 MG tablet Commonly known as:  ULTRAM Take 50 mg by mouth 4 (four) times daily.       Disposition: home   Final Dx: PLIF L2-3, L3-4, L4-5  Discharge Instructions     Remove dressing in 72 hours    Complete by:  As directed    Call MD for:  difficulty breathing, headache or visual disturbances    Complete by:  As directed  Call MD for:  persistant nausea and vomiting    Complete by:  As directed    Call MD for:  redness, tenderness, or signs of infection (pain, swelling, redness, odor or green/yellow discharge around incision site)    Complete by:  As directed    Call MD for:  severe uncontrolled pain    Complete by:  As directed    Call MD for:  temperature >100.4    Complete by:  As directed    Diet - low sodium heart healthy    Complete by:  As directed    Discharge instructions    Complete by:  As directed    May shower, no lifting or bending or twisting, no driving   Increase activity slowly    Complete by:  As directed       Follow-up Information    Blessing Zaucha S, MD. Schedule an appointment as soon as possible for a visit in 2 week(s).   Specialty:  Neurosurgery Contact information: [address] [place] [address] [phone]            Signed: Tia Alert 10/10/2016, 12:20 PM

## 2016-10-10 NOTE — Evaluation (Signed)
Occupational Therapy Evaluation Patient Details Name: Walter Holland MRN: [id number] DOB: July 04, 1956 Today's Date: 10/10/2016    History of Present Illness s/p Lumbar two-Lumbar three Transforaminal lumbar interbody fusion;  Lumbar three-Lumbar four - Lumbar four-Lumbar five MAXIMUM ACCESS (MAS) POSTERIOR LUMBAR INTERBODY FUSION (PLIF).  PMH includes DM, HTN, stroke; previous back surgery in 2014   Clinical Impression   Pt admitted with the above diagnoses and presents with below problem list. Pt will benefit from continued acute OT to address the below listed deficits and maximize independence with BADLs prior to d/c home. PTA pt was independent with ADLs. Pt is currently setup to min guard with ADLs.      Follow Up Recommendations  Supervision - Intermittent;No OT follow up    Equipment Recommendations  None recommended by OT    Recommendations for Other Services PT consult     Precautions / Restrictions Precautions Precautions: Back Required Braces or Orthoses: Spinal Brace Spinal Brace: Lumbar corset Restrictions Weight Bearing Restrictions: No      Mobility Bed Mobility               General bed mobility comments: up in chair  Transfers Overall transfer level: Needs assistance Equipment used: Rolling walker (2 wheeled) Transfers: Sit to/from Stand Sit to Stand: Min guard         General transfer comment: from recliner and comfort height toilet. min guard for safety    Balance Overall balance assessment: Needs assistance         Standing balance support: Bilateral upper extremity supported;No upper extremity supported Standing balance-Leahy Scale: Fair                              ADL Overall ADL's : Needs assistance/impaired Eating/Feeding: Set up;Sitting   Grooming: Min guard;Standing   Upper Body Bathing: Set up;Sitting   Lower Body Bathing: Min guard;Sit to/from stand   Upper Body Dressing : Set up;Sitting   Lower  Body Dressing: Min guard;Sit to/from stand   Toilet Transfer: Min guard;Ambulation;RW;Comfort height toilet;Grab bars   Toileting- Clothing Manipulation and Hygiene: Set up;Min guard;Sit to/from stand;Sitting/lateral lean   Tub/ Shower Transfer: Min guard;Ambulation;Shower seat;Rolling walker   Functional mobility during ADLs: Min guard;Rolling walker General ADL Comments: Able to elevate feet to access in sitting position for LB ADLs. ADL education provided. Cues for technique with transfers/mobility using rw     Vision     Perception     Praxis      Pertinent Vitals/Pain Pain Assessment: 0-10 Pain Score: 4  Pain Location: back Pain Descriptors / Indicators: Sore Pain Intervention(s): Monitored during session;Repositioned     Hand Dominance     Extremity/Trunk Assessment Upper Extremity Assessment Upper Extremity Assessment: Overall WFL for tasks assessed   Lower Extremity Assessment Lower Extremity Assessment: Defer to PT evaluation       Communication Communication Communication: No difficulties   Cognition Arousal/Alertness: Awake/alert Behavior During Therapy: WFL for tasks assessed/performed Overall Cognitive Status: Within Functional Limits for tasks assessed                     General Comments       Exercises       Shoulder Instructions      Home Living Family/patient expects to be discharged to:: Private residence Living Arrangements: Spouse/significant other Available Help at Discharge: Family Type of Home: House Home Access: Stairs to enter Entergy CorporationEntrance Stairs-Number of Steps:  3 Entrance Stairs-Rails: Right;Left Home Layout: One level     Bathroom Shower/Tub: Chief Strategy Officer: Handicapped height     Home Equipment: Shower seat          Prior Functioning/Environment Level of Independence: Independent                 OT Problem List: Impaired balance (sitting and/or standing);Decreased knowledge of use  of DME or AE;Decreased knowledge of precautions;Pain   OT Treatment/Interventions: Self-care/ADL training;DME and/or AE instruction;Therapeutic activities;Patient/family education;Balance training    OT Goals(Current goals can be found in the care plan section) Acute Rehab OT Goals Patient Stated Goal: not stated OT Goal Formulation: With patient Time For Goal Achievement: 10/17/16 Potential to Achieve Goals: Good ADL Goals Pt Will Perform Lower Body Bathing: with modified independence;sit to/from stand Pt Will Perform Lower Body Dressing: with modified independence;sit to/from stand Pt Will Transfer to Toilet: with modified independence;ambulating Pt Will Perform Toileting - Clothing Manipulation and hygiene: with modified independence;sitting/lateral leans;sit to/from stand Pt Will Perform Tub/Shower Transfer: with modified independence;ambulating;shower seat;rolling walker Additional ADL Goal #1: Pt will complete bed mobility at mod I level to prepare for OOB ADLs.   OT Frequency: Min 2X/week   Barriers to D/C:            Co-evaluation              End of Session Equipment Utilized During Treatment: Gait belt;Rolling walker;Back brace  Activity Tolerance: Patient tolerated treatment well Patient left: in chair;with call bell/phone within reach   Time: 0933-1000 OT Time Calculation (min): 27 min Charges:  OT General Charges $OT Visit: 1 Procedure OT Evaluation $OT Eval Low Complexity: 1 Procedure OT Treatments $Self Care/Home Management : 8-22 mins G-Codes:    Pilar Grammes 2016/11/09, 10:11 AM

## 2016-10-10 NOTE — Progress Notes (Signed)
Pt being discharged from hospital per orders from MD. Pt and family educated on discharge instructions. Pt and family verbalized understanding of discharge instructions. All questions and concerns were addressed. Pt's IV's were removed prior to discharge. Pt exited hospital via wheelchair.

## 2016-10-10 NOTE — Progress Notes (Signed)
CM left message with patients workers comp agent: Walter Holland with [employer]: [phone]. CM will continue to follow up with Walter Holland for patients d/c needs.

## 2016-10-10 NOTE — Evaluation (Signed)
Physical Therapy Evaluation Patient Details Name: Walter Holland MRN: [id number] DOB: 01/24/1956 Today's Date: 10/10/2016   History of Present Illness  s/p Lumbar two-Lumbar three Transforaminal lumbar interbody fusion;  Lumbar three-Lumbar four - Lumbar four-Lumbar five MAXIMUM ACCESS (MAS) POSTERIOR LUMBAR INTERBODY FUSION (PLIF).  PMH includes DM, HTN, stroke; previous back surgery in 2014  Clinical Impression  Patient presents with pain and post surgical deficit s/p above surgery. Tolerated gait training and stair training with min guard assist for safety. Education re: back precautions, brace, log roll technique, positioning. Pt will have support from wife at home. Will follow acutely to maximize independence and mobility prior to return home.    Follow Up Recommendations No PT follow up;Supervision - Intermittent    Equipment Recommendations  Rolling walker with 5" wheels    Recommendations for Other Services       Precautions / Restrictions Precautions Precautions: Back Precaution Booklet Issued: No Precaution Comments: Reviewed 3/3 back precautions Required Braces or Orthoses: Spinal Brace Spinal Brace: Lumbar corset Restrictions Weight Bearing Restrictions: No      Mobility  Bed Mobility Overal bed mobility: Needs Assistance Bed Mobility: Rolling;Sit to Sidelying Rolling: Supervision       Sit to sidelying: Supervision General bed mobility comments: HOB flat, no use of rails to simulate home. Cues for log roll technique.  Transfers Overall transfer level: Needs assistance Equipment used: Rolling walker (2 wheeled) Transfers: Sit to/from Stand Sit to Stand: Min guard         General transfer comment: Difficulty transferring hands from arm rests to RW; Min guard for safety.   Ambulation/Gait Ambulation/Gait assistance: Supervision Ambulation Distance (Feet): 300 Feet Assistive device: Rolling walker (2 wheeled) Gait Pattern/deviations: Step-through  pattern;Decreased stride length;Trunk flexed Gait velocity: decreased Gait velocity interpretation: Below normal speed for age/gender General Gait Details: Slow, steady gait. CUes for RW proximity.   Stairs Stairs: Yes Stairs assistance: Supervision Stair Management: Step to pattern;Two rails Number of Stairs: 3 (+ 2 steps x2 bouts) General stair comments: Cues for technique and safety.   Wheelchair Mobility    Modified Rankin (Stroke Patients Only)       Balance Overall balance assessment: Needs assistance Sitting-balance support: Feet supported;No upper extremity supported Sitting balance-Leahy Scale: Good Sitting balance - Comments: Able to doff brace without assist or LOB.   Standing balance support: During functional activity Standing balance-Leahy Scale: Fair Standing balance comment: Requires BUE support on RW.                              Pertinent Vitals/Pain Pain Assessment: 0-10 Pain Score: 4  Pain Location: back  Pain Descriptors / Indicators: Sore;Operative site guarding Pain Intervention(s): Monitored during session;Repositioned    Home Living Family/patient expects to be discharged to:: Private residence Living Arrangements: Spouse/significant other Available Help at Discharge: Family Type of Home: House Home Access: Stairs to enter Entrance Stairs-Rails: Doctor, general practiceight;Left Entrance Stairs-Number of Steps: 3 Home Layout: One level Home Equipment: Shower seat      Prior Function Level of Independence: Independent         Comments: 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      Hand Dominance        Extremity/Trunk Assessment   Upper Extremity Assessment: Defer to OT evaluation           Lower Extremity Assessment:  (Drop foot RLE - premorbid. Has an AFO he wears sometimes. )  Cervical / Trunk Assessment: Other exceptions  Communication   Communication: No difficulties  Cognition Arousal/Alertness:  Awake/alert Behavior During Therapy: WFL for tasks assessed/performed Overall Cognitive Status: Within Functional Limits for tasks assessed                      General Comments      Exercises     Assessment/Plan    PT Assessment Patient needs continued PT services  PT Problem List Decreased strength;Decreased mobility;Decreased knowledge of precautions;Decreased balance;Pain;Decreased knowledge of use of DME          PT Treatment Interventions DME instruction;Therapeutic activities;Gait training;Therapeutic exercise;Patient/family education;Balance training;Stair training;Functional mobility training    PT Goals (Current goals can be found in the Care Plan section)  Acute Rehab PT Goals Patient Stated Goal: to go home today PT Goal Formulation: With patient Time For Goal Achievement: 10/24/16 Potential to Achieve Goals: Good    Frequency Min 5X/week   Barriers to discharge Inaccessible home environment stairs to enter home    Co-evaluation               End of Session Equipment Utilized During Treatment: Gait belt;Back brace Activity Tolerance: Patient tolerated treatment well Patient left: in bed;with call bell/phone within reach;with nursing/sitter in room Nurse Communication: Mobility status         Time: 1010-1028 PT Time Calculation (min) (ACUTE ONLY): 18 min   Charges:   PT Evaluation $PT Eval Low Complexity: 1 Procedure     PT G Codes:        Adrieana Fennelly A Ranjit Ashurst 10/10/2016, 10:59 AM  Mylo Red, PT, DPT [phone]

## 2016-10-11 ENCOUNTER — Encounter (HOSPITAL_COMMUNITY): Payer: Self-pay | Admitting: Neurological Surgery

## 2017-05-14 IMAGING — CR DG CHEST 2V
2 series · 2 of 2 positions shown · non-contrast
Comparison: 04/02/2013

CLINICAL DATA: Lumbar spinal stenosis.  Preop respiratory exam.

EXAM:
CHEST  2 VIEW

[w chest pa]
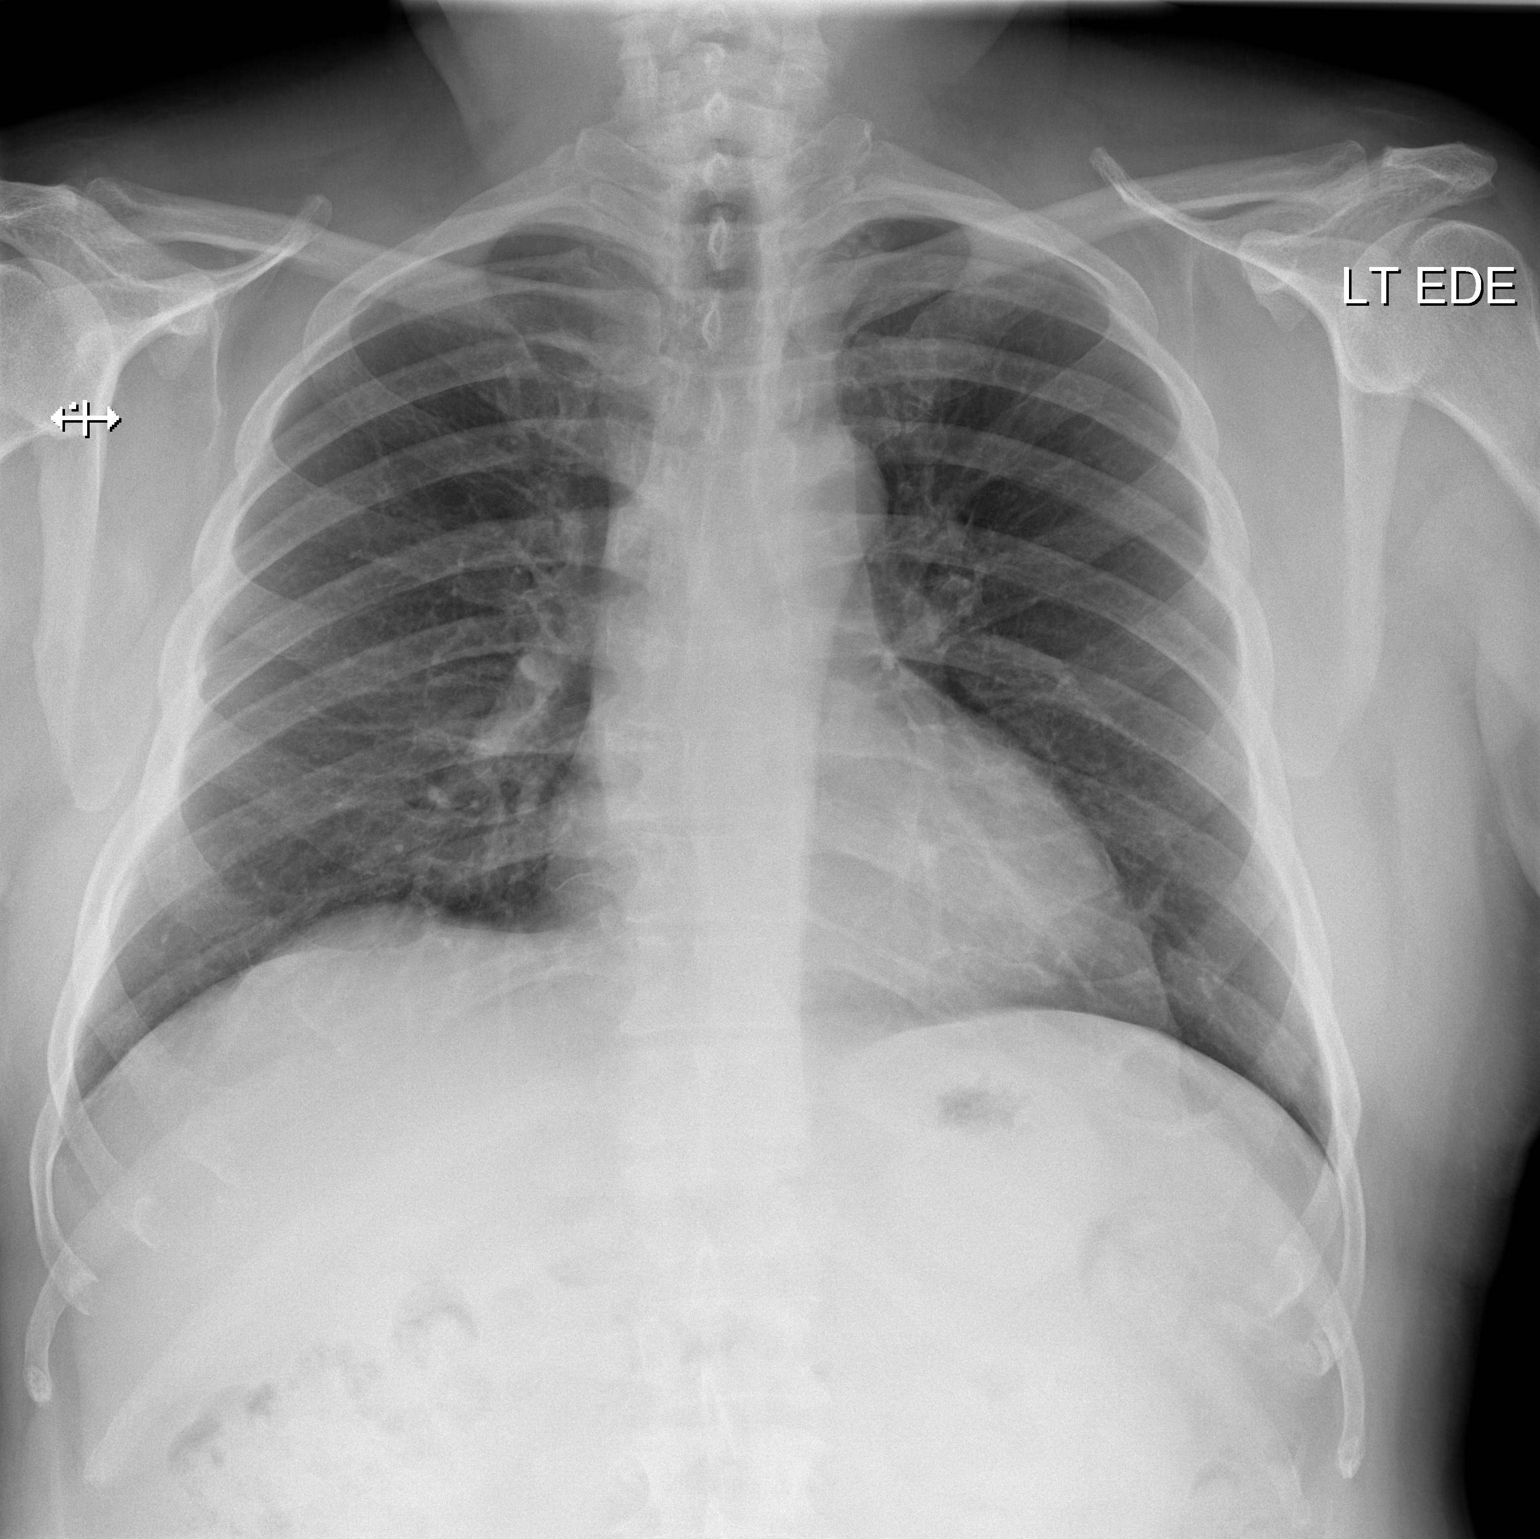

[w chest lat]
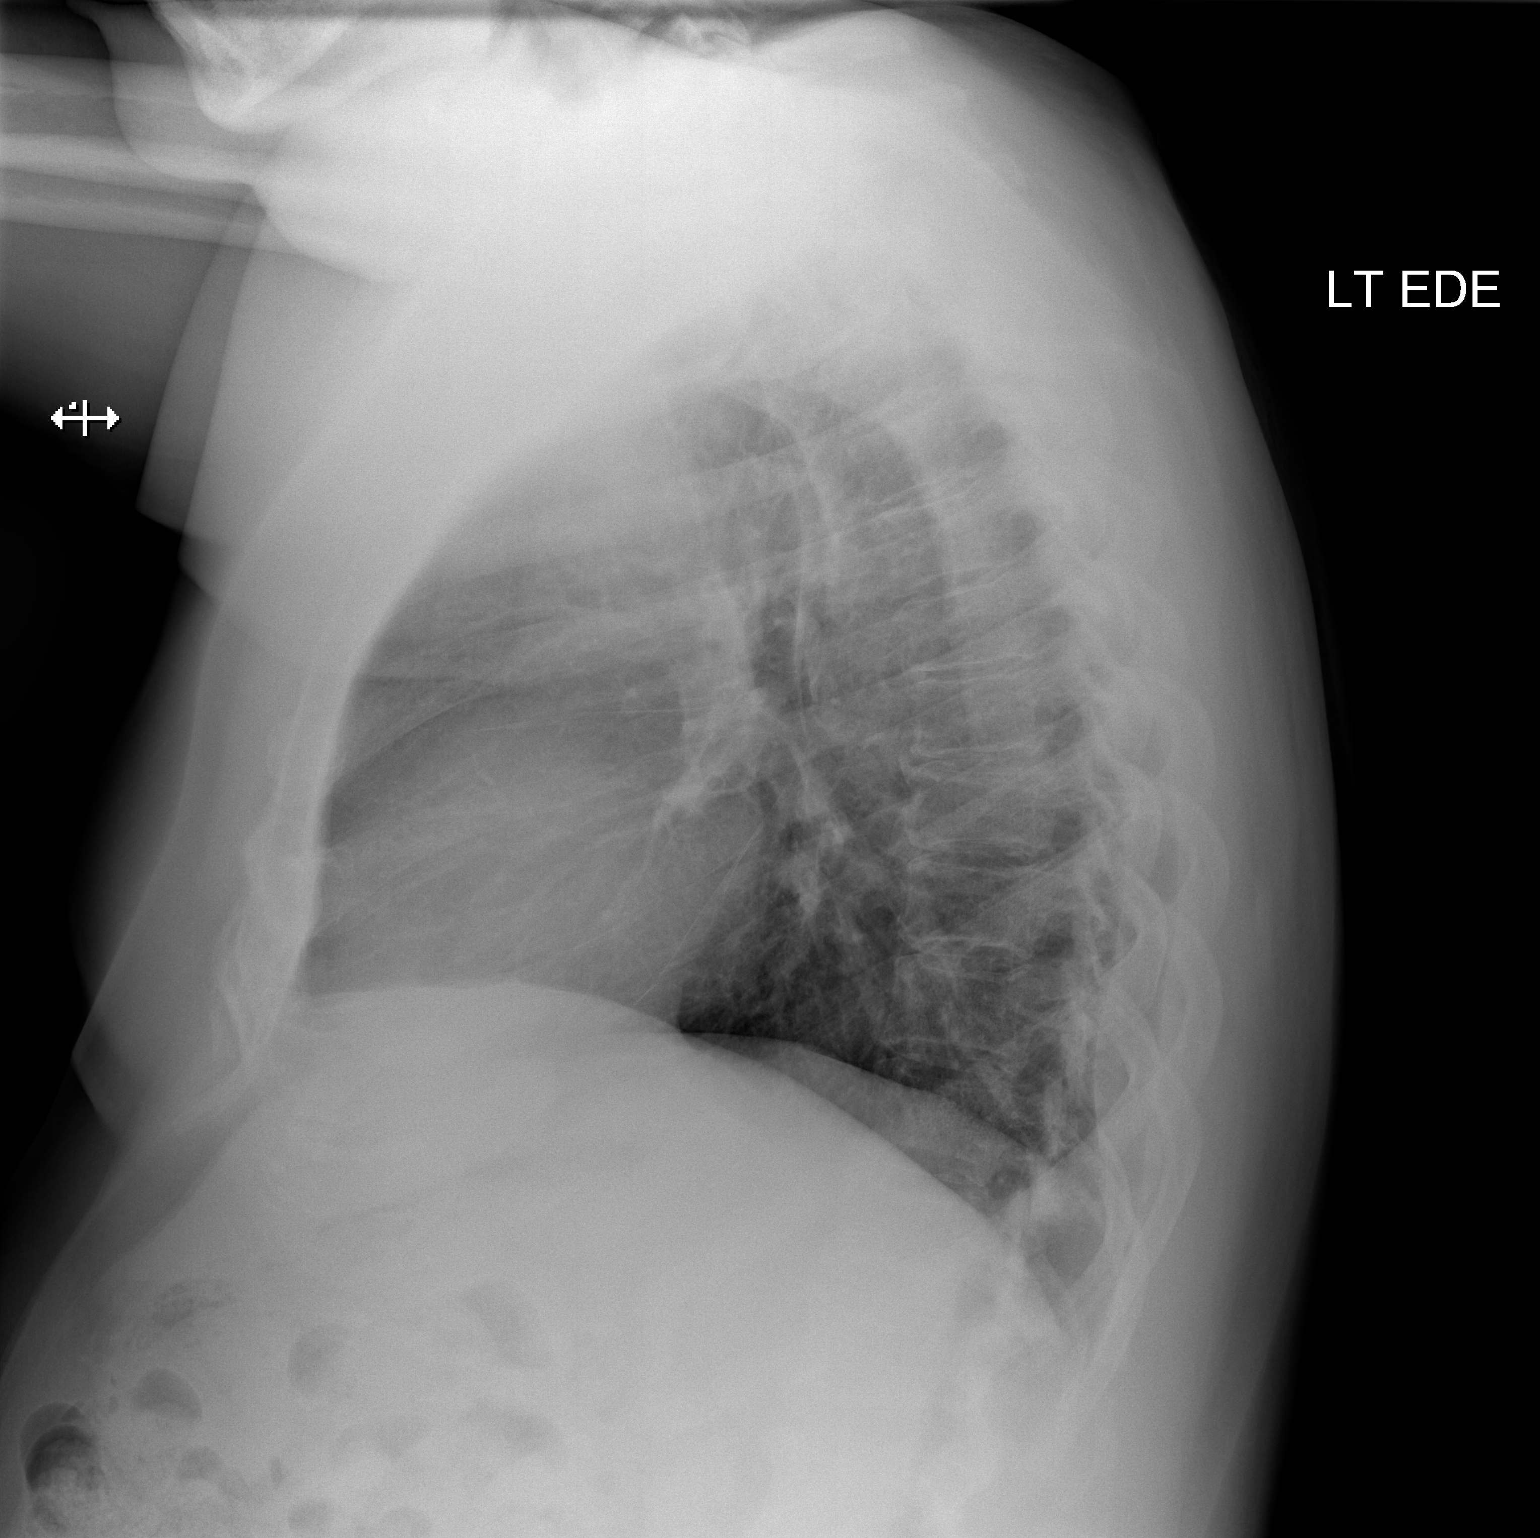

[2 of 2 positions shown; findings below may reference images not displayed]

FINDINGS: The heart size and mediastinal contours are within normal limits.
Both lungs are clear. The visualized skeletal structures are
unremarkable.
IMPRESSION: No active cardiopulmonary disease.

## 2017-05-16 IMAGING — RF DG LUMBAR SPINE 2-3V
1 series · 3 of 3 positions shown · non-contrast
Comparison: 07/08/2016

CLINICAL DATA: Posterior fusion

EXAM:
DG C-ARM 61-120 MIN; LUMBAR SPINE - 2-3 VIEW

[Series 1: run · 3 of 3 slices shown]
[im 1/3]
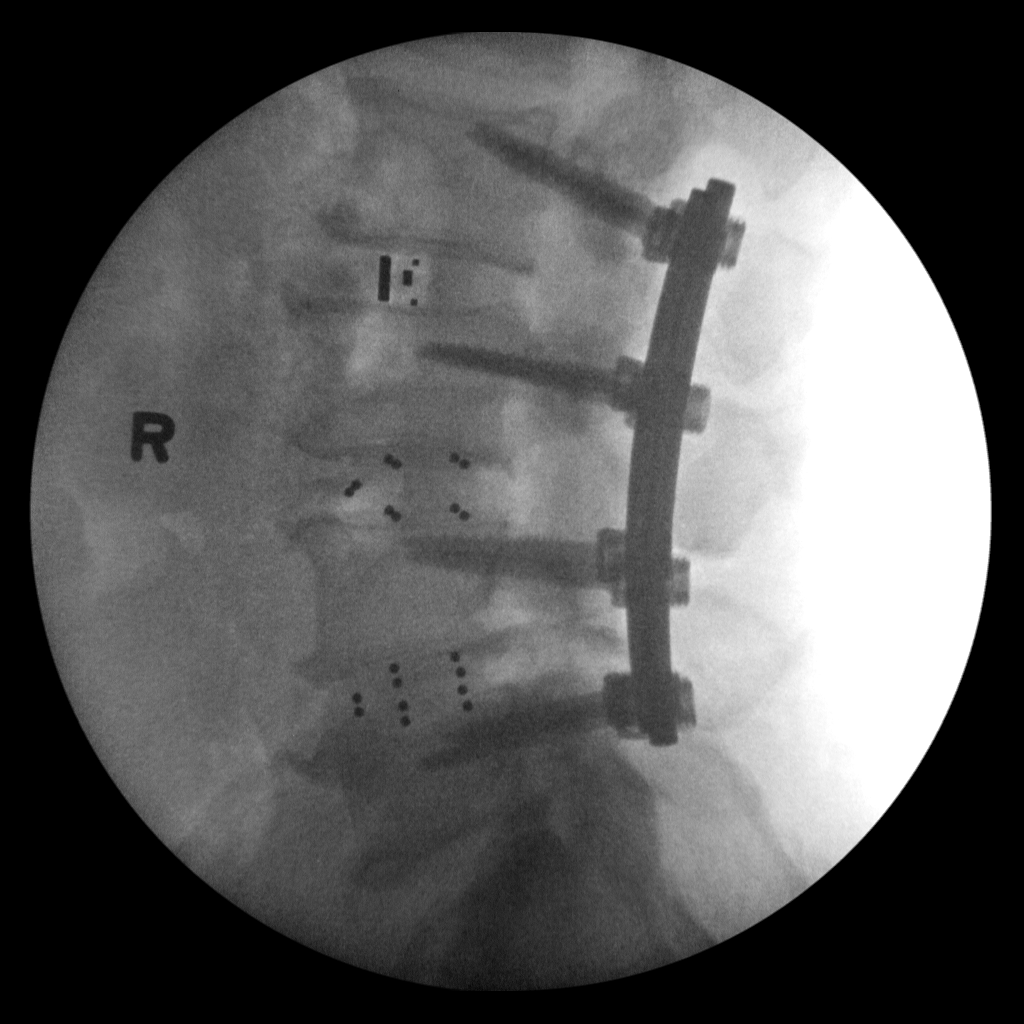
[im 2/3]
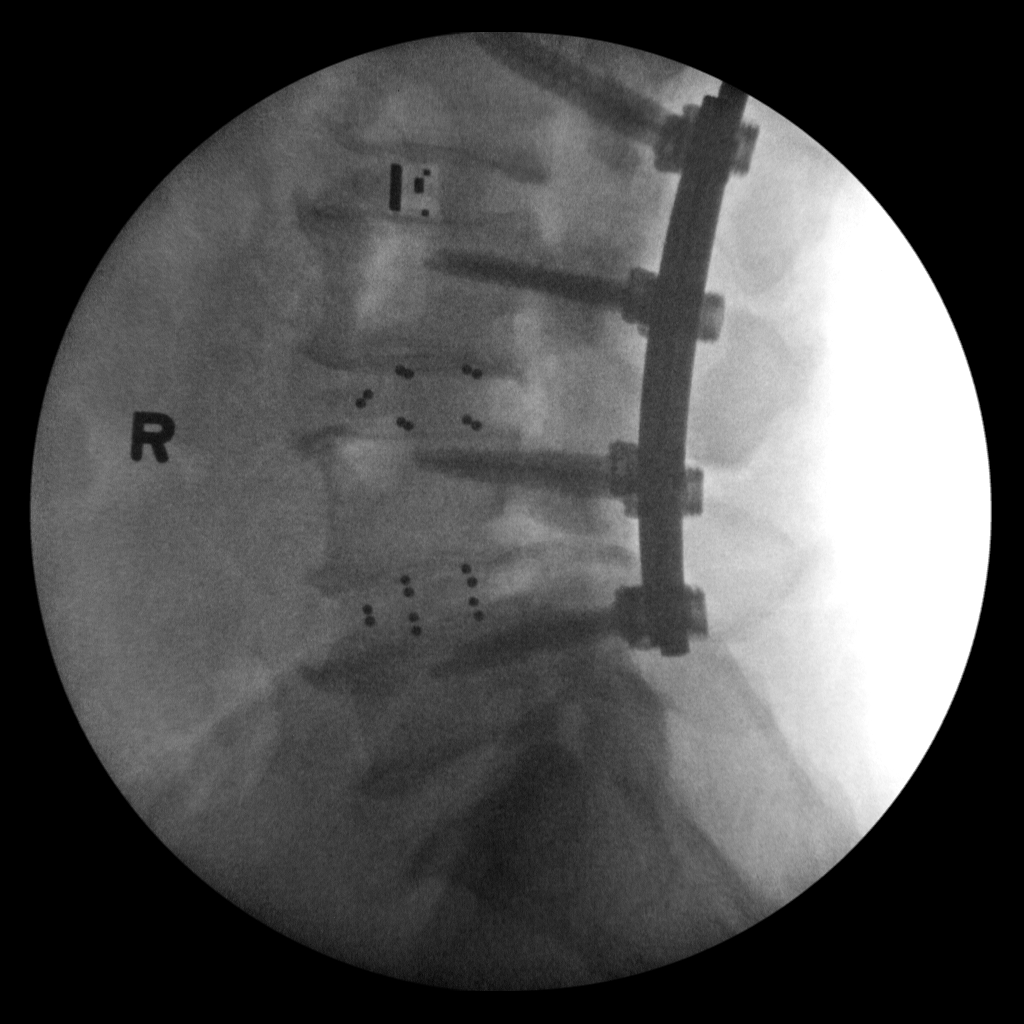
[im 3/3]
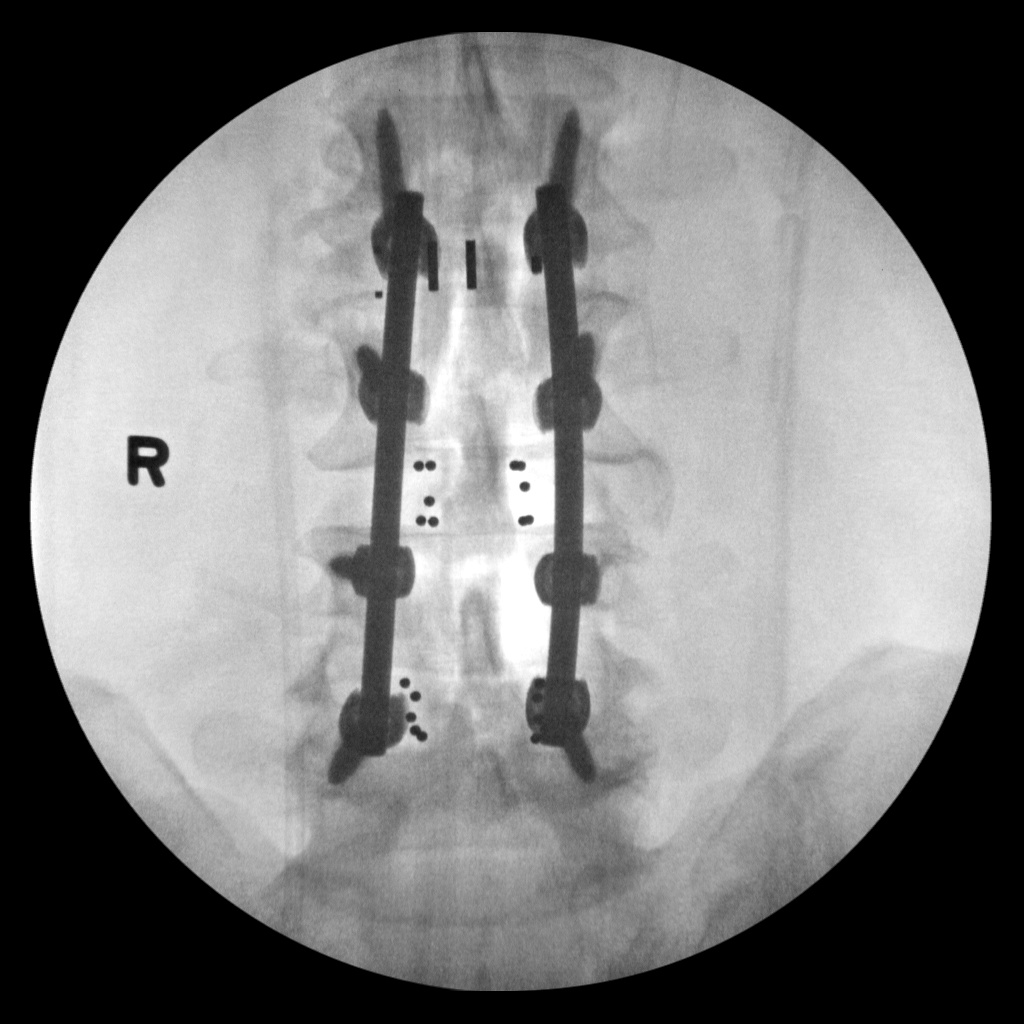

[3 of 3 positions shown; findings below may reference images not displayed]

FINDINGS: Three views of the lumbar spine submitted. There is posterior fusion
with transpedicular screws and metallic rods at L2, L3-L4 and L5
level. There is anatomic alignment. Postsurgical disc spacer
material noted at L2-L3, L3-L4 and L4-L5 level.

Fluoroscopy time was 1 minutes 57 seconds. Please see the operative
report.
IMPRESSION: Posterior fusion L2-L5 level with anatomic alignment.

## 2020-02-24 ENCOUNTER — Ambulatory Visit: Payer: Self-pay | Attending: Internal Medicine

## 2020-02-24 DIAGNOSIS — Z23 Encounter for immunization: Secondary | ICD-10-CM | POA: Insufficient documentation

## 2020-02-24 NOTE — Progress Notes (Signed)
   Covid-19 Vaccination Clinic  Name:  Walter Holland    MRN: [id number] DOB: [birth year]  02/24/2020  [name] was observed post Covid-19 immunization for 15 minutes without incident. He was provided with Vaccine Information Sheet and instruction to access the V-Safe system.   [name] was instructed to call 911 with any severe reactions post vaccine: Marland Kitchen Difficulty breathing  . Swelling of face and throat  . A fast heartbeat  . A bad rash all over body  . Dizziness and weakness   Immunizations Administered    Name Date Dose VIS Date Route   Moderna COVID-19 Vaccine 02/24/2020  9:18 AM 0.5 mL 11/23/2019 Intramuscular   Manufacturer: Moderna   Lot: [id number]   NDC: 82417-530-10

## 2020-03-28 ENCOUNTER — Ambulatory Visit: Payer: Self-pay | Attending: Internal Medicine

## 2020-03-28 DIAGNOSIS — Z23 Encounter for immunization: Secondary | ICD-10-CM

## 2020-03-28 NOTE — Progress Notes (Signed)
   Covid-19 Vaccination Clinic  Name:  Keedan Sample    MRN: [id number] DOB: October 07, 1956  03/28/2020  Mr. Domangue was observed post Covid-19 immunization for 15 minutes without incident. He was provided with Vaccine Information Sheet and instruction to access the V-Safe system.   Mr. Yeagle was instructed to call 911 with any severe reactions post vaccine: Marland Kitchen Difficulty breathing  . Swelling of face and throat  . A fast heartbeat  . A bad rash all over body  . Dizziness and weakness   Immunizations Administered    Name Date Dose VIS Date Route   Moderna COVID-19 Vaccine 03/28/2020  8:37 AM 0.5 mL 11/23/2019 Intramuscular   Manufacturer: Moderna   Lot: [id number]   NDC: 76811-572-62

## 2022-09-25 NOTE — H&P (Signed)
Surgical History & Physical  Patient Name: Walter Holland DOB: May 13, 1956  Surgery: Cataract extraction with intraocular lens implant phacoemulsification; Left Eye  Surgeon: Baruch Goldmann MD Surgery Date:  09-30-22 Pre-Op Date:  09-19-22  HPI: A 12 Yr. old male patient 1. The patient complains of difficulty when driving at night, which began many years ago. Both eyes are affected. The episode is constant. The patient describes glare and hazy symptoms affecting their eyes/vision. The condition's severity is worsening. Symptoms occur when the patient is driving and reading. The patient experiences no flashes, floater, shadow, curtain or veil. is referred by Dr Hassell Done for cataract eval. Pt c/o diff reading, blurry vision, diff driving at night started 5 years ago. Both eyes are affected. This is negatively affecting the patient's quality of life and the patient is unable to function adequately in life with the current level of vision. Pt is not using eye drops. HPI was performed by Baruch Goldmann .  Medical History: Cataracts Arthritis Diabetes GERD High Blood Pressure LDL  Review of Systems Negative Allergic/Immunologic Negative Cardiovascular Negative Constitutional Negative Ear, Nose, Mouth & Throat Negative Endocrine Negative Eyes Negative Gastrointestinal Negative Genitourinary Negative Hemotologic/Lymphatic Negative Integumentary Negative Musculoskeletal Negative Neurological Negative Psychiatry Negative Respiratory  Social   Former smoker   Medication  Levemir, Humalog, Benazepril, HCTZ, Omeprazole, Simvastatin, Lavokamet, Terbinafine, Tizandine,   Sx/Procedures  Back Surgery,   Drug Allergies   NKDA  History & Physical: Heent: Cataract, left eye NECK: supple without bruits LUNGS: lungs clear to auscultation CV: regular rate and rhythm Abdomen: soft and non-tender Impression & Plan: Assessment: 1.  COMBINED FORMS AGE RELATED CATARACT; Both Eyes  (H25.813) 2.  Myopia ; Left Eye (H52.12) 3.  BLEPHARITIS; Right Upper Lid, Right Lower Lid (H01.001, H01.002) 4.  DERMATOCHALASIS, no surgery; Right Upper Lid, Left Upper Lid (H02.831, H02.834) 5.  Pinguecula; Right Eye (H11.151) 6.  NUCLEAR SCLEROSIS AGE RELATED; Both Eyes (H25.13) 7.  OAG BORDERLINE FINDINGS LOW RISK; Both Eyes (H40.013) 8.  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  Cataract accounts for the patient's decreased vision. This visual impairment is not correctable with a tolerable change in glasses or contact lenses. Cataract surgery with an implantation of a new lens should significantly improve the visual and functional status of the patient. Discussed all risks, benefits, alternatives, and potential complications. Discussed the procedures and recovery. Patient desires to have surgery. A-scan ordered and performed today for intra-ocular lens calculations. The surgery will be performed in order to improve vision for driving, reading, and for eye examinations. Recommend phacoemulsification with intra-ocular lens. Recommend Dextenza for post-operative pain and inflammation. Left Eye worse - first.. Dilates poorly - shugarcaine by protocol. Malyugin Ring. Omidira.  2.   3.  Recommend regular lid cleaning.  4.   5.  Observe; Artificial tears as needed for irritation.  6.   7.  Based on cup-to-disc ratio. IOPs WNL OU. OCT rNFL shows: borderline OD, thinning OS. Detailed discussion about glaucoma today including importance of maintaining good follow up and following treatment plan, and the possibility of irreversible blindness as part of this disease process. Will need a more comprehensive glaucoma screening after cataract surgery.  8.  Recommend toric IOL right eye only.

## 2022-09-26 ENCOUNTER — Encounter (HOSPITAL_COMMUNITY)
Admission: RE | Admit: 2022-09-26 | Discharge: 2022-09-26 | Disposition: A | Discharge status: Home / Self Care | Payer: Self-pay | Source: Ambulatory Visit | Attending: Ophthalmology | Admitting: Ophthalmology

## 2022-09-26 NOTE — Pre-Procedure Instructions (Signed)
Attempted pre-op phone call. No answer, phone just rang.

## 2022-09-27 NOTE — Pre-Procedure Instructions (Signed)
We have been unable to contact patient by phone. I spoke with Mackie Pai over the phone and gave her pre-op information for patient. She is going to try and contact him.

## 2022-09-30 ENCOUNTER — Encounter (HOSPITAL_COMMUNITY): Payer: Self-pay | Admitting: Ophthalmology

## 2022-09-30 ENCOUNTER — Encounter (HOSPITAL_COMMUNITY)
Admission: RE | Disposition: A | Discharge status: Home / Self Care | Payer: Self-pay | Source: Home / Self Care | Attending: Ophthalmology

## 2022-09-30 ENCOUNTER — Ambulatory Visit (HOSPITAL_COMMUNITY)
Admission: RE | Admit: 2022-09-30 | Discharge: 2022-09-30 | Disposition: A | Discharge status: Home / Self Care | Payer: Medicare Other | Attending: Ophthalmology | Admitting: Ophthalmology

## 2022-09-30 ENCOUNTER — Ambulatory Visit (HOSPITAL_COMMUNITY): Payer: Medicare Other | Admitting: Anesthesiology

## 2022-09-30 ENCOUNTER — Ambulatory Visit (HOSPITAL_BASED_OUTPATIENT_CLINIC_OR_DEPARTMENT_OTHER): Payer: Medicare Other | Admitting: Anesthesiology

## 2022-09-30 DIAGNOSIS — Z794 Long term (current) use of insulin: Secondary | ICD-10-CM | POA: Diagnosis not present

## 2022-09-30 DIAGNOSIS — H5212 Myopia, left eye: Secondary | ICD-10-CM | POA: Diagnosis not present

## 2022-09-30 DIAGNOSIS — K219 Gastro-esophageal reflux disease without esophagitis: Secondary | ICD-10-CM | POA: Insufficient documentation

## 2022-09-30 DIAGNOSIS — I10 Essential (primary) hypertension: Secondary | ICD-10-CM | POA: Diagnosis not present

## 2022-09-30 DIAGNOSIS — H02834 Dermatochalasis of left upper eyelid: Secondary | ICD-10-CM | POA: Diagnosis not present

## 2022-09-30 DIAGNOSIS — H0100A Unspecified blepharitis right eye, upper and lower eyelids: Secondary | ICD-10-CM | POA: Insufficient documentation

## 2022-09-30 DIAGNOSIS — Z7984 Long term (current) use of oral hypoglycemic drugs: Secondary | ICD-10-CM

## 2022-09-30 DIAGNOSIS — H52221 Regular astigmatism, right eye: Secondary | ICD-10-CM | POA: Diagnosis not present

## 2022-09-30 DIAGNOSIS — H40013 Open angle with borderline findings, low risk, bilateral: Secondary | ICD-10-CM | POA: Diagnosis not present

## 2022-09-30 DIAGNOSIS — Z8673 Personal history of transient ischemic attack (TIA), and cerebral infarction without residual deficits: Secondary | ICD-10-CM | POA: Diagnosis not present

## 2022-09-30 DIAGNOSIS — H25812 Combined forms of age-related cataract, left eye: Secondary | ICD-10-CM

## 2022-09-30 DIAGNOSIS — H02831 Dermatochalasis of right upper eyelid: Secondary | ICD-10-CM | POA: Insufficient documentation

## 2022-09-30 DIAGNOSIS — E1136 Type 2 diabetes mellitus with diabetic cataract: Secondary | ICD-10-CM | POA: Insufficient documentation

## 2022-09-30 DIAGNOSIS — H11151 Pinguecula, right eye: Secondary | ICD-10-CM | POA: Insufficient documentation

## 2022-09-30 DIAGNOSIS — Z87891 Personal history of nicotine dependence: Secondary | ICD-10-CM | POA: Diagnosis not present

## 2022-09-30 DIAGNOSIS — E119 Type 2 diabetes mellitus without complications: Secondary | ICD-10-CM

## 2022-09-30 HISTORY — PX: CATARACT EXTRACTION W/PHACO: SHX586

## 2022-09-30 LAB — GLUCOSE, CAPILLARY
Glucose-Capillary: 76 mg/dL (ref 70–99)
Glucose-Capillary: 79 mg/dL (ref 70–99)

## 2022-09-30 SURGERY — PHACOEMULSIFICATION, CATARACT, WITH IOL INSERTION
Anesthesia: Monitor Anesthesia Care | Site: Eye | Laterality: Left

## 2022-09-30 MED ORDER — STERILE WATER FOR IRRIGATION IR SOLN
Status: DC | PRN
  Administered 2022-09-30: 50 mL

## 2022-09-30 MED ORDER — PHENYLEPHRINE-KETOROLAC 1-0.3 % IO SOLN
INTRAOCULAR | Status: DC | PRN
  Administered 2022-09-30: 500 mL via OPHTHALMIC

## 2022-09-30 MED ORDER — BSS IO SOLN
INTRAOCULAR | Status: DC | PRN
  Administered 2022-09-30: 15 mL via INTRAOCULAR

## 2022-09-30 MED ORDER — LIDOCAINE HCL 3.5 % OP GEL
1.0000 | Freq: Once | OPHTHALMIC | Status: AC
  Administered 2022-09-30: 1 via OPHTHALMIC

## 2022-09-30 MED ORDER — MIDAZOLAM HCL 5 MG/5ML IJ SOLN
INTRAMUSCULAR | Status: DC | PRN
  Administered 2022-09-30: 2 mg via INTRAVENOUS

## 2022-09-30 MED ORDER — EPINEPHRINE PF 1 MG/ML IJ SOLN
INTRAMUSCULAR | Status: AC
  Filled 2022-09-30: qty 1

## 2022-09-30 MED ORDER — POVIDONE-IODINE 5 % OP SOLN
OPHTHALMIC | Status: DC | PRN
  Administered 2022-09-30: 1 via OPHTHALMIC

## 2022-09-30 MED ORDER — MOXIFLOXACIN HCL 0.5 % OP SOLN
OPHTHALMIC | Status: DC | PRN
  Administered 2022-09-30: 2 [drp] via OPHTHALMIC

## 2022-09-30 MED ORDER — TROPICAMIDE 1 % OP SOLN
1.0000 [drp] | OPHTHALMIC | Status: AC | PRN
  Administered 2022-09-30 (×3): 1 [drp] via OPHTHALMIC

## 2022-09-30 MED ORDER — LIDOCAINE HCL (PF) 1 % IJ SOLN
INTRAOCULAR | Status: DC | PRN
  Administered 2022-09-30: 1 mL via OPHTHALMIC

## 2022-09-30 MED ORDER — PHENYLEPHRINE HCL 2.5 % OP SOLN
1.0000 [drp] | OPHTHALMIC | Status: AC | PRN
  Administered 2022-09-30 (×3): 1 [drp] via OPHTHALMIC

## 2022-09-30 MED ORDER — LACTATED RINGERS IV SOLN: INTRAVENOUS | Status: DC

## 2022-09-30 MED ORDER — SODIUM HYALURONATE 10 MG/ML IO SOLUTION
PREFILLED_SYRINGE | INTRAOCULAR | Status: DC | PRN
  Administered 2022-09-30: 0.85 mL via INTRAOCULAR

## 2022-09-30 MED ORDER — TETRACAINE HCL 0.5 % OP SOLN
1.0000 [drp] | OPHTHALMIC | Status: AC | PRN
  Administered 2022-09-30 (×3): 1 [drp] via OPHTHALMIC

## 2022-09-30 MED ORDER — PHENYLEPHRINE-KETOROLAC 1-0.3 % IO SOLN
INTRAOCULAR | Status: AC
  Filled 2022-09-30: qty 4

## 2022-09-30 MED ORDER — SODIUM HYALURONATE 23MG/ML IO SOSY
PREFILLED_SYRINGE | INTRAOCULAR | Status: DC | PRN
  Administered 2022-09-30: 0.6 mL via INTRAOCULAR

## 2022-09-30 MED ORDER — MIDAZOLAM HCL 2 MG/2ML IJ SOLN
INTRAMUSCULAR | Status: AC
  Filled 2022-09-30: qty 2

## 2022-09-30 SURGICAL SUPPLY — 16 items
CATARACT SUITE SIGHTPATH (MISCELLANEOUS) ×1 IMPLANT
CLOTH BEACON ORANGE TIMEOUT ST (SAFETY) ×1 IMPLANT
EYE SHIELD UNIVERSAL CLEAR (GAUZE/BANDAGES/DRESSINGS) IMPLANT
FEE CATARACT SUITE SIGHTPATH (MISCELLANEOUS) ×1 IMPLANT
GLOVE BIOGEL PI IND STRL 7.0 (GLOVE) ×2 IMPLANT
GLOVE SS BIOGEL STRL SZ 6.5 (GLOVE) IMPLANT
GOWN SRG XL 47XLVL 4 REINF (GOWN DISPOSABLE) IMPLANT
GOWN STRL NON-REIN XL LVL4 (GOWN DISPOSABLE) ×1
LENS IOL RAYNER 22.5 (Intraocular Lens) ×1 IMPLANT
LENS IOL RAYONE EMV 22.5 (Intraocular Lens) IMPLANT
NDL HYPO 18GX1.5 BLUNT FILL (NEEDLE) ×1 IMPLANT
NEEDLE HYPO 18GX1.5 BLUNT FILL (NEEDLE) ×1 IMPLANT
PAD ARMBOARD 7.5X6 YLW CONV (MISCELLANEOUS) ×1 IMPLANT
SYR TB 1ML LL NO SAFETY (SYRINGE) ×1 IMPLANT
TAPE SURG TRANSPORE 1 IN (GAUZE/BANDAGES/DRESSINGS) IMPLANT
TAPE SURGICAL TRANSPORE 1 IN (GAUZE/BANDAGES/DRESSINGS) ×1

## 2022-09-30 NOTE — Anesthesia Postprocedure Evaluation (Signed)
Anesthesia Post Note  Patient: Walter Holland  Procedure(s) Performed: CATARACT EXTRACTION PHACO AND INTRAOCULAR LENS PLACEMENT (IOC) (Left: Eye)  Patient location during evaluation: Phase II Anesthesia Type: MAC Level of consciousness: awake and alert and oriented Pain management: pain level controlled Vital Signs Assessment: post-procedure vital signs reviewed and stable Respiratory status: respiratory function stable, spontaneous breathing and nonlabored ventilation Cardiovascular status: stable and blood pressure returned to baseline Postop Assessment: no apparent nausea or vomiting Anesthetic complications: no   There were no known notable events for this encounter.   Last Vitals:  Vitals:   09/30/22 1215 09/30/22 1351  BP: 125/63 120/67  Pulse: (!) 54 (!) 53  Resp: 15 10  Temp: 36.6 C 36.9 C  SpO2: 98% 100%    Last Pain:  Vitals:   09/30/22 1351  TempSrc: Oral  PainSc: 0-No pain                 Bertice Risse C Lynnzie Blackson

## 2022-09-30 NOTE — Anesthesia Preprocedure Evaluation (Addendum)
Anesthesia Evaluation  Patient identified by MRN, date of birth, ID band Patient awake    Reviewed: Allergy & Precautions, NPO status , Patient's Chart, lab work & pertinent test results, reviewed documented beta blocker date and time   Airway Mallampati: III  TM Distance: >3 FB Neck ROM: Full    Dental  (+) Dental Advisory Given, Missing   Pulmonary former smoker,    Pulmonary exam normal breath sounds clear to auscultation       Cardiovascular Exercise Tolerance: Good hypertension, Pt. on medications and Pt. on home beta blockers Normal cardiovascular exam Rhythm:Regular Rate:Normal     Neuro/Psych CVA, Residual Symptoms negative psych ROS   GI/Hepatic GERD  Medicated and Controlled,(+)     substance abuse  alcohol use,   Endo/Other  diabetes, Well Controlled, Type 2, Oral Hypoglycemic Agents, Insulin Dependent  Renal/GU negative Renal ROS  negative genitourinary   Musculoskeletal  (+) Arthritis , Osteoarthritis,    Abdominal   Peds negative pediatric ROS (+)  Hematology negative hematology ROS (+)   Anesthesia Other Findings MAXIMUM ACCESS (MAS)POSTERIOR LUMBAR INTERBODY FUSION (PLIF) 3 LEVEL 10/09/2016   Reproductive/Obstetrics negative OB ROS                           Anesthesia Physical Anesthesia Plan  ASA: 3  Anesthesia Plan: MAC   Post-op Pain Management: Minimal or no pain anticipated   Induction:   PONV Risk Score and Plan: Treatment may vary due to age or medical condition  Airway Management Planned: Natural Airway and Nasal Cannula  Additional Equipment:   Intra-op Plan:   Post-operative Plan:   Informed Consent: I have reviewed the patients History and Physical, chart, labs and discussed the procedure including the risks, benefits and alternatives for the proposed anesthesia with the patient or authorized representative who has indicated his/her understanding  and acceptance.       Plan Discussed with: CRNA and Surgeon  Anesthesia Plan Comments:         Anesthesia Quick Evaluation

## 2022-09-30 NOTE — Interval H&P Note (Signed)
History and Physical Interval Note:  09/30/2022 1:37 PM  Walter Holland  has presented today for surgery, with the diagnosis of combined forms age related cataract; left.  The various methods of treatment have been discussed with the patient and family. After consideration of risks, benefits and other options for treatment, the patient has consented to  Procedure(s) with comments: CATARACT EXTRACTION PHACO AND INTRAOCULAR LENS PLACEMENT (IOC) (Left) - CDE:  as a surgical intervention.  The patient's history has been reviewed, patient examined, no change in status, stable for surgery.  I have reviewed the patient's chart and labs.  Questions were answered to the patient's satisfaction.     Baruch Goldmann

## 2022-09-30 NOTE — Transfer of Care (Signed)
Immediate Anesthesia Transfer of Care Note  Patient: Walter Holland  Procedure(s) Performed: CATARACT EXTRACTION PHACO AND INTRAOCULAR LENS PLACEMENT (IOC) (Left: Eye)  Patient Location: PACU and Short Stay  Anesthesia Type:MAC  Level of Consciousness: awake  Airway & Oxygen Therapy: Patient Spontanous Breathing  Post-op Assessment: Report given to RN  Post vital signs: Reviewed  Last Vitals:  Vitals Value Taken Time  BP    Temp    Pulse    Resp    SpO2      Last Pain:  Vitals:   09/30/22 1215  TempSrc: Oral  PainSc: 0-No pain         Complications: There were no known notable events for this encounter.

## 2022-09-30 NOTE — Op Note (Signed)
Date of procedure: 09/30/22  Pre-operative diagnosis: Visually significant age-related combined cataract, Left Eye (H25.812)  Post-operative diagnosis: Visually significant age-related combined cataract, Left Eye (H25.812)  Procedure: Removal of cataract via phacoemulsification and insertion of intra-ocular lens Rayner [id number] +22.5D into the capsular bag of the Left Eye  Attending surgeon: Gerda Diss. Vernetta Dizdarevic, MD, MA  Anesthesia: MAC, Topical Akten  Complications: None  Estimated Blood Loss: <72m (minimal)  Specimens: None  Implants: As above  Indications:  Visually significant age-related cataract, Left Eye  Procedure:  The patient was seen and identified in the pre-operative area. The operative eye was identified and dilated.  The operative eye was marked.  Topical anesthesia was administered to the operative eye.     The patient was then to the operative suite and placed in the supine position.  A timeout was performed confirming the patient, procedure to be performed, and all other relevant information.   The patient's face was prepped and draped in the usual fashion for intra-ocular surgery.  A lid speculum was placed into the operative eye and the surgical microscope moved into place and focused.  An inferotemporal paracentesis was created using a 20 gauge paracentesis blade.  Shugarcaine was injected into the anterior chamber.  Viscoelastic was injected into the anterior chamber.  A temporal clear-corneal main wound incision was created using a 2.44mmicrokeratome.  A continuous curvilinear capsulorrhexis was initiated using an irrigating cystitome and completed using capsulorrhexis forceps.  Hydrodissection and hydrodeliniation were performed.  Viscoelastic was injected into the anterior chamber.  A phacoemulsification handpiece and a chopper as a second instrument were used to remove the nucleus and epinucleus. The irrigation/aspiration handpiece was used to remove any remaining  cortical material.   The capsular bag was reinflated with viscoelastic, checked, and found to be intact.  The intraocular lens was inserted into the capsular bag.  The irrigation/aspiration handpiece was used to remove any remaining viscoelastic.  The clear corneal wound and paracentesis wounds were then hydrated and checked with Weck-Cels to be watertight.  Moxifloxacin drops were instilled on the eye. The lid-speculum was removed.  The drape was removed.  The patient's face was cleaned with a wet and dry 4x4.    A clear shield was taped over the eye. The patient was taken to the post-operative care unit in good condition, having tolerated the procedure well.  Post-Op Instructions: [facility] and will receive all other orders and instructions.

## 2022-09-30 NOTE — Discharge Instructions (Addendum)
Please discharge patient when stable, will follow up today with Dr. Lucella Pommier at the [facility] office immediately following discharge.  Leave shield in place until visit.  All paperwork with discharge instructions will be given at the office.  [facility] Address:  [address]  , [place] [address]   PATIENT INSTRUCTIONS POST-ANESTHESIA  IMMEDIATELY FOLLOWING SURGERY:  Do not drive or operate machinery for the first twenty four hours after surgery.  Do not make any important decisions for twenty four hours after surgery or while taking narcotic pain medications or sedatives.  If you develop intractable nausea and vomiting or a severe headache please notify your doctor immediately.  FOLLOW-UP:  Please make an appointment with your surgeon as instructed. You do not need to follow up with anesthesia unless specifically instructed to do so.  WOUND CARE INSTRUCTIONS (if applicable):  Keep a dry clean dressing on the anesthesia/puncture wound site if there is drainage.  Once the wound has quit draining you may leave it open to air.  Generally you should leave the bandage intact for twenty four hours unless there is drainage.  If the epidural site drains for more than 36-48 hours please call the anesthesia department.  QUESTIONS?:  Please feel free to call your physician or the hospital operator if you have any questions, and they will be happy to assist you.       

## 2022-10-02 ENCOUNTER — Encounter (HOSPITAL_COMMUNITY): Payer: Self-pay | Admitting: Ophthalmology

## 2022-10-07 NOTE — H&P (Signed)
Surgical History & Physical  Patient Name: Walter Holland DOB: [birth year]  Surgery: Cataract extraction with intraocular lens implant phacoemulsification; Right Eye  Surgeon: Baruch Goldmann MD Surgery Date:  10-14-22 Pre-Op Date:  10-07-22  HPI: A 2 Yr. old male patient 1. The patient is returning after cataract post-op. The left eye is affected. Status post cataract post-op, which began 1 week ago: Since the last visit, the affected area is doing well. The patient's vision is stable. Patient is following medication instructions. The patient complains of difficulty when driving at night, which began many years ago. Right eye is affected. The episode is constant. The patient describes glare and hazy symptoms affecting their eyes/vision. The condition's severity is worsening. Symptoms occur when the patient is driving and reading. This is negatively affecting the patient's quality of life and the patient is unable to function adequately in life with the current level of vision. The patient experiences no flashes, floater, shadow, curtain or veil. HPI Completed by Dr. Baruch Goldmann  Medical History: Cataracts Arthritis Diabetes GERD High Blood Pressure LDL  Review of Systems Negative Allergic/Immunologic Negative Cardiovascular Negative Constitutional Negative Ear, Nose, Mouth & Throat Negative Endocrine Negative Eyes Negative Gastrointestinal Negative Genitourinary Negative Hemotologic/Lymphatic Negative Integumentary Negative Musculoskeletal Negative Neurological Negative Psychiatry Negative Respiratory  Social   Former smoker   Medication Prednisolone-Moxifloxacin-Bromfenac,  Levemir, Humalog, Benazepril, HCTZ, Omeprazole, Simvastatin, Lavokamet, Terbinafine, Tizandine,   Sx/Procedures Phaco c IOL OS,  Back Surgery,   Drug Allergies   NKDA  History & Physical: Heent: Cataract, right eye NECK: supple without bruits LUNGS: lungs clear to auscultation CV: regular  rate and rhythm Abdomen: soft and non-tender Impression & Plan: Assessment: 1.  CATARACT EXTRACTION STATUS; Left Eye (Z98.42) 2.  COMBINED FORMS AGE RELATED CATARACT; Right Eye (H25.811) 3.  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  1 week after cataract surgery. Doing well with improved vision and normal eye pressure. Call with any problems or concerns. Continue Pred-Moxi-Brom 2x/day for 3 more weeks.  2.  Right Eye. Surgery required to correct imbalance of vision. Dilates poorly - Omidria by protocol. Malyugin Ring. Recommend Toric IOL OD. Cataract accounts for the patient's decreased vision. This visual impairment is not correctable with a tolerable change in glasses or contact lenses. Cataract surgery with an implantation of a new lens should significantly improve the visual and functional status of the patient. Discussed all risks, benefits, alternatives, and potential complications. Discussed the procedures and recovery. Patient desires to have surgery. A-scan ordered and performed today for intra-ocular lens calculations. The surgery will be performed in order to improve vision for driving, reading, and for eye examinations. Recommend phacoemulsification with intra-ocular lens. Recommend Dextenza for post-operative pain and inflammation.  3.   4.  Recommend toric IOL right eye only.  5.  Asymptomatic.

## 2022-10-08 ENCOUNTER — Encounter (HOSPITAL_COMMUNITY)
Admission: RE | Admit: 2022-10-08 | Discharge: 2022-10-08 | Disposition: A | Discharge status: Home / Self Care | Payer: Medicare Other | Source: Ambulatory Visit | Attending: Ophthalmology | Admitting: Ophthalmology

## 2022-10-08 ENCOUNTER — Other Ambulatory Visit: Payer: Self-pay

## 2022-10-08 NOTE — Pre-Procedure Instructions (Signed)
Attempted pre-op phone call. Left message for him to call back.

## 2022-10-14 ENCOUNTER — Encounter (HOSPITAL_COMMUNITY)
Admission: RE | Disposition: A | Discharge status: Home / Self Care | Payer: Self-pay | Source: Home / Self Care | Attending: Ophthalmology

## 2022-10-14 ENCOUNTER — Other Ambulatory Visit: Payer: Self-pay

## 2022-10-14 ENCOUNTER — Ambulatory Visit (HOSPITAL_BASED_OUTPATIENT_CLINIC_OR_DEPARTMENT_OTHER): Payer: Medicare Other | Admitting: Anesthesiology

## 2022-10-14 ENCOUNTER — Ambulatory Visit (HOSPITAL_COMMUNITY)
Admission: RE | Admit: 2022-10-14 | Discharge: 2022-10-14 | Disposition: A | Discharge status: Home / Self Care | Payer: Medicare Other | Attending: Ophthalmology | Admitting: Ophthalmology

## 2022-10-14 ENCOUNTER — Ambulatory Visit (HOSPITAL_COMMUNITY): Payer: Medicare Other | Admitting: Anesthesiology

## 2022-10-14 ENCOUNTER — Encounter (HOSPITAL_COMMUNITY): Payer: Self-pay | Admitting: Ophthalmology

## 2022-10-14 DIAGNOSIS — Z87891 Personal history of nicotine dependence: Secondary | ICD-10-CM | POA: Insufficient documentation

## 2022-10-14 DIAGNOSIS — H11823 Conjunctivochalasis, bilateral: Secondary | ICD-10-CM | POA: Insufficient documentation

## 2022-10-14 DIAGNOSIS — H52221 Regular astigmatism, right eye: Secondary | ICD-10-CM | POA: Diagnosis not present

## 2022-10-14 DIAGNOSIS — I10 Essential (primary) hypertension: Secondary | ICD-10-CM | POA: Diagnosis not present

## 2022-10-14 DIAGNOSIS — H25811 Combined forms of age-related cataract, right eye: Secondary | ICD-10-CM | POA: Diagnosis not present

## 2022-10-14 DIAGNOSIS — H5203 Hypermetropia, bilateral: Secondary | ICD-10-CM | POA: Insufficient documentation

## 2022-10-14 DIAGNOSIS — Z79899 Other long term (current) drug therapy: Secondary | ICD-10-CM | POA: Insufficient documentation

## 2022-10-14 DIAGNOSIS — Z8673 Personal history of transient ischemic attack (TIA), and cerebral infarction without residual deficits: Secondary | ICD-10-CM | POA: Diagnosis not present

## 2022-10-14 DIAGNOSIS — K219 Gastro-esophageal reflux disease without esophagitis: Secondary | ICD-10-CM | POA: Diagnosis not present

## 2022-10-14 DIAGNOSIS — M1990 Unspecified osteoarthritis, unspecified site: Secondary | ICD-10-CM | POA: Diagnosis not present

## 2022-10-14 DIAGNOSIS — Z7984 Long term (current) use of oral hypoglycemic drugs: Secondary | ICD-10-CM | POA: Insufficient documentation

## 2022-10-14 DIAGNOSIS — E1136 Type 2 diabetes mellitus with diabetic cataract: Secondary | ICD-10-CM | POA: Insufficient documentation

## 2022-10-14 DIAGNOSIS — Z794 Long term (current) use of insulin: Secondary | ICD-10-CM | POA: Insufficient documentation

## 2022-10-14 DIAGNOSIS — Z9842 Cataract extraction status, left eye: Secondary | ICD-10-CM | POA: Diagnosis not present

## 2022-10-14 HISTORY — PX: CATARACT EXTRACTION W/PHACO: SHX586

## 2022-10-14 LAB — GLUCOSE, CAPILLARY: Glucose-Capillary: 94 mg/dL (ref 70–99)

## 2022-10-14 SURGERY — PHACOEMULSIFICATION, CATARACT, WITH IOL INSERTION
Anesthesia: Monitor Anesthesia Care | Site: Eye | Laterality: Right

## 2022-10-14 MED ORDER — MIDAZOLAM HCL 2 MG/2ML IJ SOLN
INTRAMUSCULAR | Status: DC | PRN
  Administered 2022-10-14: 2 mg via INTRAVENOUS

## 2022-10-14 MED ORDER — TROPICAMIDE 1 % OP SOLN
1.0000 [drp] | OPHTHALMIC | Status: AC | PRN
  Administered 2022-10-14 (×3): 1 [drp] via OPHTHALMIC

## 2022-10-14 MED ORDER — SODIUM HYALURONATE 23MG/ML IO SOSY
PREFILLED_SYRINGE | INTRAOCULAR | Status: DC | PRN
  Administered 2022-10-14: 0.6 mL via INTRAOCULAR

## 2022-10-14 MED ORDER — SODIUM CHLORIDE 0.9% FLUSH
INTRAVENOUS | Status: DC | PRN
  Administered 2022-10-14: 5 mL via INTRAVENOUS

## 2022-10-14 MED ORDER — STERILE WATER FOR IRRIGATION IR SOLN
Status: DC | PRN
  Administered 2022-10-14: 250 mL

## 2022-10-14 MED ORDER — PHENYLEPHRINE HCL 2.5 % OP SOLN
1.0000 [drp] | OPHTHALMIC | Status: AC | PRN
  Administered 2022-10-14 (×3): 1 [drp] via OPHTHALMIC

## 2022-10-14 MED ORDER — EPINEPHRINE PF 1 MG/ML IJ SOLN
INTRAMUSCULAR | Status: AC
  Filled 2022-10-14: qty 1

## 2022-10-14 MED ORDER — TETRACAINE HCL 0.5 % OP SOLN
1.0000 [drp] | OPHTHALMIC | Status: AC | PRN
  Administered 2022-10-14 (×3): 1 [drp] via OPHTHALMIC

## 2022-10-14 MED ORDER — MOXIFLOXACIN HCL 5 MG/ML IO SOLN
INTRAOCULAR | Status: AC
  Filled 2022-10-14: qty 1

## 2022-10-14 MED ORDER — SODIUM HYALURONATE 10 MG/ML IO SOLUTION
PREFILLED_SYRINGE | INTRAOCULAR | Status: DC | PRN
  Administered 2022-10-14: 0.85 mL via INTRAOCULAR

## 2022-10-14 MED ORDER — POVIDONE-IODINE 5 % OP SOLN
OPHTHALMIC | Status: DC | PRN
  Administered 2022-10-14: 1 via OPHTHALMIC

## 2022-10-14 MED ORDER — MOXIFLOXACIN HCL 0.5 % OP SOLN
OPHTHALMIC | Status: DC | PRN
  Administered 2022-10-14: 0.2 mL

## 2022-10-14 MED ORDER — LIDOCAINE HCL 3.5 % OP GEL
1.0000 | Freq: Once | OPHTHALMIC | Status: AC
  Administered 2022-10-14: 1 via OPHTHALMIC

## 2022-10-14 MED ORDER — PHENYLEPHRINE-KETOROLAC 1-0.3 % IO SOLN
INTRAOCULAR | Status: AC
  Filled 2022-10-14: qty 4

## 2022-10-14 MED ORDER — MIDAZOLAM HCL 2 MG/2ML IJ SOLN
INTRAMUSCULAR | Status: AC
  Filled 2022-10-14: qty 2

## 2022-10-14 MED ORDER — PHENYLEPHRINE-KETOROLAC 1-0.3 % IO SOLN
INTRAOCULAR | Status: DC | PRN
  Administered 2022-10-14: 500 mL via OPHTHALMIC

## 2022-10-14 MED ORDER — LIDOCAINE HCL (PF) 1 % IJ SOLN
INTRAOCULAR | Status: DC | PRN
  Administered 2022-10-14: 1 mL via OPHTHALMIC

## 2022-10-14 MED ORDER — BSS IO SOLN
INTRAOCULAR | Status: DC | PRN
  Administered 2022-10-14: 15 mL via INTRAOCULAR

## 2022-10-14 SURGICAL SUPPLY — 16 items
CATARACT SUITE SIGHTPATH (MISCELLANEOUS) ×1 IMPLANT
CLOTH BEACON ORANGE TIMEOUT ST (SAFETY) ×1 IMPLANT
DRAPE HALF SHEET 40X57 (DRAPES) IMPLANT
EYE SHIELD UNIVERSAL CLEAR (GAUZE/BANDAGES/DRESSINGS) IMPLANT
FEE CATARACT SUITE SIGHTPATH (MISCELLANEOUS) ×1 IMPLANT
GLOVE BIOGEL PI IND STRL 7.0 (GLOVE) ×2 IMPLANT
GLOVE SS BIOGEL STRL SZ 6.5 (GLOVE) IMPLANT
LENS IOL RAYNER 23.5 (Intraocular Lens) ×1 IMPLANT
LENS IOL RAYONE EMV 23.5 (Intraocular Lens) IMPLANT
NDL HYPO 18GX1.5 BLUNT FILL (NEEDLE) ×1 IMPLANT
NEEDLE HYPO 18GX1.5 BLUNT FILL (NEEDLE) ×1 IMPLANT
PAD ARMBOARD 7.5X6 YLW CONV (MISCELLANEOUS) ×1 IMPLANT
SYR TB 1ML LL NO SAFETY (SYRINGE) ×1 IMPLANT
TAPE SURG TRANSPORE 1 IN (GAUZE/BANDAGES/DRESSINGS) IMPLANT
TAPE SURGICAL TRANSPORE 1 IN (GAUZE/BANDAGES/DRESSINGS) ×1
WATER STERILE IRR 250ML POUR (IV SOLUTION) ×1 IMPLANT

## 2022-10-14 NOTE — Anesthesia Postprocedure Evaluation (Signed)
Anesthesia Post Note  Patient: Walter Holland  Procedure(s) Performed: CATARACT EXTRACTION PHACO AND INTRAOCULAR LENS PLACEMENT (IOC) (Right: Eye)  Patient location during evaluation: Phase II Anesthesia Type: MAC Level of consciousness: awake Pain management: pain level controlled Vital Signs Assessment: post-procedure vital signs reviewed and stable Respiratory status: spontaneous breathing and respiratory function stable Cardiovascular status: blood pressure returned to baseline and stable Postop Assessment: no headache and no apparent nausea or vomiting Anesthetic complications: no Comments: Late entry   No notable events documented.   Last Vitals:  Vitals:   10/14/22 0939  BP: 115/64  Pulse: (!) 50  Resp: 19  Temp: 36.6 C  SpO2: 100%    Last Pain:  Vitals:   10/14/22 0939  TempSrc: Oral  PainSc: 0-No pain                 Louann Sjogren

## 2022-10-14 NOTE — Discharge Instructions (Addendum)
Please discharge patient when stable, will follow up today with Dr. Wrzosek at the [facility] [place] office immediately following discharge.  Leave shield in place until visit.  All paperwork with discharge instructions will be given at the office.  [facility] Address:  [address]  [place], [place] [address]  

## 2022-10-14 NOTE — Interval H&P Note (Signed)
History and Physical Interval Note:  10/14/2022 9:13 AM  Walter Holland  has presented today for surgery, with the diagnosis of combined forms age related cataract; right.  The various methods of treatment have been discussed with the patient and family. After consideration of risks, benefits and other options for treatment, the patient has consented to  Procedure(s) with comments: CATARACT EXTRACTION PHACO AND INTRAOCULAR LENS PLACEMENT ([place]) (Right) - CDE as a surgical intervention.  The patient's history has been reviewed, patient examined, no change in status, stable for surgery.  I have reviewed the patient's chart and labs.  Questions were answered to the patient's satisfaction.     Baruch Goldmann

## 2022-10-14 NOTE — Transfer of Care (Signed)
Immediate Anesthesia Transfer of Care Note  Patient: Walter Holland  Procedure(s) Performed: CATARACT EXTRACTION PHACO AND INTRAOCULAR LENS PLACEMENT (IOC) (Right: Eye)  Patient Location: Short Stay  Anesthesia Type:MAC  Level of Consciousness: awake, alert , oriented and patient cooperative  Airway & Oxygen Therapy: Patient Spontanous Breathing  Post-op Assessment: Report given to RN, Post -op Vital signs reviewed and stable and Patient moving all extremities X 4  Post vital signs: Reviewed and stable  Last Vitals:  Vitals Value Taken Time  BP    Temp    Pulse    Resp    SpO2      Last Pain:  Vitals:   10/14/22 0839  PainSc: 0-No pain      Patients Stated Pain Goal: 4 (08/81/10 3159)  Complications: No notable events documented.

## 2022-10-14 NOTE — Anesthesia Preprocedure Evaluation (Signed)
Anesthesia Evaluation  Patient identified by MRN, date of birth, ID band Patient awake    Reviewed: Allergy & Precautions, NPO status , Patient's Chart, lab work & pertinent test results, reviewed documented beta blocker date and time   Airway Mallampati: III  TM Distance: >3 FB Neck ROM: Full    Dental  (+) Dental Advisory Given, Missing   Pulmonary former smoker,    Pulmonary exam normal breath sounds clear to auscultation       Cardiovascular Exercise Tolerance: Good hypertension, Pt. on medications and Pt. on home beta blockers Normal cardiovascular exam Rhythm:Regular Rate:Normal     Neuro/Psych CVA, Residual Symptoms negative psych ROS   GI/Hepatic GERD  Medicated and Controlled,(+)     substance abuse  alcohol use,   Endo/Other  diabetes, Well Controlled, Type 2, Oral Hypoglycemic Agents, Insulin Dependent  Renal/GU negative Renal ROS  negative genitourinary   Musculoskeletal  (+) Arthritis , Osteoarthritis,    Abdominal   Peds negative pediatric ROS (+)  Hematology negative hematology ROS (+)   Anesthesia Other Findings MAXIMUM ACCESS (MAS)POSTERIOR LUMBAR INTERBODY FUSION (PLIF) 3 LEVEL 10/09/2016   Reproductive/Obstetrics negative OB ROS                             Anesthesia Physical  Anesthesia Plan  ASA: 3  Anesthesia Plan: MAC   Post-op Pain Management: Minimal or no pain anticipated   Induction:   PONV Risk Score and Plan: Treatment may vary due to age or medical condition  Airway Management Planned: Natural Airway and Nasal Cannula  Additional Equipment:   Intra-op Plan:   Post-operative Plan:   Informed Consent: I have reviewed the patients History and Physical, chart, labs and discussed the procedure including the risks, benefits and alternatives for the proposed anesthesia with the patient or authorized representative who has indicated his/her  understanding and acceptance.       Plan Discussed with: CRNA and Surgeon  Anesthesia Plan Comments:         Anesthesia Quick Evaluation

## 2022-10-14 NOTE — Op Note (Signed)
Date of procedure: 10/14/22  Pre-operative diagnosis:  Visually significant combined form age-related cataract, Right Eye (H25.811)  Post-operative diagnosis:  Visually significant combined form age-related cataract, Right Eye (H25.811)  Procedure: Removal of cataract via phacoemulsification and insertion of intra-ocular lens Rayner [id number] +23.5D into the capsular bag of the Right Eye  Attending surgeon: Gerda Diss. Harlan Ervine, MD, MA  Anesthesia: MAC, Topical Akten  Complications: None  Estimated Blood Loss: <67m (minimal)  Specimens: None  Implants: As above  Indications:  Visually significant age-related cataract, Right Eye  Procedure:  The patient was seen and identified in the pre-operative area. The operative eye was identified and dilated.  The operative eye was marked.  Topical anesthesia was administered to the operative eye.     The patient was then to the operative suite and placed in the supine position.  A timeout was performed confirming the patient, procedure to be performed, and all other relevant information.   The patient's face was prepped and draped in the usual fashion for intra-ocular surgery.  A lid speculum was placed into the operative eye and the surgical microscope moved into place and focused.  A superotemporal paracentesis was created using a 20 gauge paracentesis blade.  Shugarcaine was injected into the anterior chamber.  Viscoelastic was injected into the anterior chamber.  A temporal clear-corneal main wound incision was created using a 2.453mmicrokeratome.  A continuous curvilinear capsulorrhexis was initiated using an irrigating cystitome and completed using capsulorrhexis forceps.  Hydrodissection and hydrodeliniation were performed.  Viscoelastic was injected into the anterior chamber.  A phacoemulsification handpiece and a chopper as a second instrument were used to remove the nucleus and epinucleus. The irrigation/aspiration handpiece was used to remove any  remaining cortical material.   The capsular bag was reinflated with viscoelastic, checked, and found to be intact.  The intraocular lens was inserted into the capsular bag.  The irrigation/aspiration handpiece was used to remove any remaining viscoelastic.  The clear corneal wound and paracentesis wounds were then hydrated and checked with Weck-Cels to be watertight. 0.10m60mf moxifloxacin was injected into the anterior chamber. The lid-speculum was removed.  The drape was removed.  The patient's face was cleaned with a wet and dry 4x4. A clear shield was taped over the eye. The patient was taken to the post-operative care unit in good condition, having tolerated the procedure well.  Post-Op Instructions: [facility] and will receive all other orders and instructions.

## 2022-10-15 ENCOUNTER — Encounter (HOSPITAL_COMMUNITY): Payer: Self-pay | Admitting: Ophthalmology
